# Patient Record
Sex: Female | Born: 1965 | Race: White | Hispanic: No | Smoking: Never smoker
Health system: Southern US, Community
[De-identification: ages and names within clinical notes are randomized; demographics above are authoritative.]

## PROBLEM LIST (undated history)

## (undated) DIAGNOSIS — M069 Rheumatoid arthritis, unspecified: Secondary | ICD-10-CM

## (undated) DIAGNOSIS — R011 Cardiac murmur, unspecified: Secondary | ICD-10-CM

## (undated) DIAGNOSIS — M1990 Unspecified osteoarthritis, unspecified site: Secondary | ICD-10-CM

## (undated) DIAGNOSIS — E785 Hyperlipidemia, unspecified: Secondary | ICD-10-CM

## (undated) HISTORY — PX: TOTAL ANKLE REPLACEMENT: SUR1218

## (undated) HISTORY — PX: JOINT REPLACEMENT: SHX530

## (undated) HISTORY — PX: TOTAL SHOULDER ARTHROPLASTY: SHX126

## (undated) HISTORY — PX: TOTAL KNEE ARTHROPLASTY: SHX125

## (undated) HISTORY — PX: ESOPHAGOGASTRODUODENOSCOPY: SHX1529

## (undated) HISTORY — PX: OTHER SURGICAL HISTORY: SHX169

## (undated) HISTORY — PX: TOTAL ELBOW REPLACEMENT: SUR1214

## (undated) HISTORY — PX: REPLACEMENT TOTAL KNEE: SUR1224

## (undated) HISTORY — PX: TOTAL WRIST ARTHROPLASTY: SHX813

## (undated) HISTORY — PX: REVISION TOTAL HIP ARTHROPLASTY: SHX766

## (undated) HISTORY — PX: BREAST SURGERY: SHX581

## (undated) HISTORY — PX: COLONOSCOPY: SHX174

## (undated) HISTORY — PX: TOTAL ELBOW ARTHROPLASTY: SHX812

## (undated) HISTORY — PX: WISDOM TOOTH EXTRACTION: SHX21

## (undated) HISTORY — PX: UPPER GASTROINTESTINAL ENDOSCOPY: SHX188

## (undated) HISTORY — PX: TOTAL ANKLE ARTHROPLASTY: SHX811

## (undated) HISTORY — PX: MANDIBLE RECONSTRUCTION: SHX431

## (undated) HISTORY — PX: TOTAL SHOULDER REPLACEMENT: SUR1217

## (undated) HISTORY — PX: TOTAL HIP ARTHROPLASTY: SHX124

## (undated) HISTORY — DX: Hyperlipidemia, unspecified: E78.5

## (undated) HISTORY — PX: WRIST FUSION: SHX839

---

## 2019-06-21 ENCOUNTER — Encounter (HOSPITAL_COMMUNITY): Payer: Self-pay | Admitting: *Deleted

## 2019-06-21 ENCOUNTER — Ambulatory Visit (HOSPITAL_COMMUNITY)
Admission: EM | Admit: 2019-06-21 | Discharge: 2019-06-21 | Disposition: A | Discharge status: Home / Self Care | Attending: Emergency Medicine | Admitting: Emergency Medicine

## 2019-06-21 ENCOUNTER — Other Ambulatory Visit: Payer: Self-pay

## 2019-06-21 DIAGNOSIS — J029 Acute pharyngitis, unspecified: Secondary | ICD-10-CM | POA: Insufficient documentation

## 2019-06-21 DIAGNOSIS — Z20822 Contact with and (suspected) exposure to covid-19: Secondary | ICD-10-CM

## 2019-06-21 HISTORY — DX: Rheumatoid arthritis, unspecified: M06.9

## 2019-06-21 LAB — POCT RAPID STREP A: Streptococcus, Group A Screen (Direct): NEGATIVE

## 2019-06-21 NOTE — Discharge Instructions (Signed)
Throat lozenges, gargles, chloraseptic spray, warm teas, popsicles etc to help with throat pain.   Tylenol and/or ibuprofen as needed for pain or fevers.   Due to your symptoms in presence of pandemic I do recommend Covid-19 testing. You will be called to set up an appointment time to be tested at our [facility]. Results take about 2-3 days.  Please self isolate until you receive this results.   Please return or go to the ER for any worsening of symptoms.

## 2019-06-21 NOTE — ED Triage Notes (Signed)
C/O chills, fevers up to 104, nausea, sore throat x 2 days.  Also c/o slightly pruritic generalized red rash.

## 2019-06-21 NOTE — ED Provider Notes (Signed)
[facility]    CSN: [id number] Arrival date & time: 06/21/19  1645     History   Chief Complaint Chief Complaint  Patient presents with  . Fever  . Sore Throat    HPI Diana Peters is a 53 y.o. female.   Diana Peters presents with complaints of sore throat for approximately 5 days. Fever up to 104. Chills. Has been helping her parents at there house. So thought it was maybe an arthritis flare. Rash. Sore throat. Has had strep in the past which presented similar. Today temp up to 104. Motrin 2 hours ago. Rash to bilateral upper arms, mild itching. Noted last night. Nausea, no vomiting. No cough, no congestion. No known ill contacts. No new dizziness. No urinary symptoms. Body aches and headache. Body aches can be attributed to arthritis and activity recently, however. Patient states her BP is typically in the 80's, her BP today is typical for her. Hx of RA.    ROS per HPI, negative if not otherwise mentioned.      Past Medical History:  Diagnosis Date  . Rheumatoid arthritis (HCC)   . Rheumatoid arthritis (HCC)     There are no active problems to display for this patient.   Past Surgical History:  Procedure Laterality Date  . CESAREAN SECTION     x2  . great toe fusion    . JOINT REPLACEMENT    . MANDIBLE RECONSTRUCTION    . REVISION TOTAL HIP ARTHROPLASTY     bilat  . TOTAL ANKLE ARTHROPLASTY     bilat  . TOTAL ELBOW ARTHROPLASTY     bilat  . TOTAL HIP ARTHROPLASTY     bilat  . TOTAL KNEE ARTHROPLASTY     bilat  . TOTAL SHOULDER ARTHROPLASTY     bilateral  . WRIST FUSION      OB History   No obstetric history on file.      Home Medications    Prior to Admission medications   Medication Sig Start Date End Date Taking? Authorizing Provider  azaTHIOprine (IMURAN PO) Take by mouth.   Yes [provider]  HYDROcodone-Acetaminophen (NORCO PO) Take by mouth.   Yes [provider]  Meloxicam (MOBIC PO) Take by mouth.   Yes  [provider]  MORPHINE SULFATE PO Take by mouth.   Yes [provider]  Solifenacin Succinate (VESICARE PO) Take by mouth.   Yes [provider]  Tofacitinib Citrate (XELJANZ PO) Take by mouth.   Yes [provider]    Family History Family History  Problem Relation Age of Onset  . Healthy Mother   . Healthy Father     Social History Social History   Tobacco Use  . Smoking status: Never Smoker  . Smokeless tobacco: Never Used  Substance Use Topics  . Alcohol use: Never    Frequency: Never  . Drug use: Never     Allergies   Patient has no known allergies.   Review of Systems Review of Systems   Physical Exam Triage Vital Signs ED Triage Vitals  Enc Vitals Group     BP 06/21/19 1803 (!) 85/52     Pulse Rate 06/21/19 1803 94     Resp 06/21/19 1803 18     Temp 06/21/19 1803 98.9 F (37.2 C)     Temp Source 06/21/19 1803 Temporal     SpO2 06/21/19 1803 96 %     Weight --      Height --  Head Circumference --      Peak Flow --      Pain Score 06/21/19 1805 5     Pain Loc --      Pain Edu? --      Excl. in [place]? --    No data found.  Updated Vital Signs BP (!) 85/52 (BP Location: Left Arm)   Pulse 94   Temp 98.9 F (37.2 C) (Temporal) Comment: took IBU approx 2 hrs ago  Resp 18   SpO2 96%    Physical Exam Constitutional:      General: She is not in acute distress.    Appearance: She is well-developed.  HENT:     Head: Normocephalic and atraumatic.     Mouth/Throat:     Mouth: Mucous membranes are moist. No oral lesions.     Pharynx: Posterior oropharyngeal erythema present. No pharyngeal swelling.     Tonsils: No tonsillar exudate. 1+ on the right. 1+ on the left.  Cardiovascular:     Rate and Rhythm: Normal rate and regular rhythm.     Heart sounds: Normal heart sounds.  Pulmonary:     Effort: Pulmonary effort is normal.     Breath sounds: Normal breath sounds.  Lymphadenopathy:     Cervical: Cervical  adenopathy present.  Skin:    General: Skin is warm and dry.     Findings: Rash present. Rash is urticarial.     Comments: Bilateral upper arms with red rash, non tender; no papules or vesicles   Neurological:     Mental Status: She is alert and oriented to person, place, and time.      UC Treatments / Results  Labs (all labs ordered are listed, but only abnormal results are displayed) Labs Reviewed  CULTURE, GROUP A STREP [facility])  POCT RAPID STREP A    EKG   Radiology No results found.  Procedures Procedures (including critical care time)  Medications Ordered in UC Medications - No data to display  Initial Impression / Assessment and Plan / UC Course  I have reviewed the triage vital signs and the nursing notes.  Pertinent labs & imaging results that were available during my care of the patient were reviewed by me and considered in my medical decision making (see chart for details).     Non toxic in appearance. Afebrile here. Throat without significant physical findings. Noted BP in 80's, patient insists this is normal for her and denies symptoms of this. She states she is primarily concerned about strep throat, negative today, culture pending. covid testing also ordered due to medications for RA she is taking. Supportive cares recommended. Return precautions provided. Patient verbalized understanding and agreeable to plan.   Final Clinical Impressions(s) / UC Diagnoses   Final diagnoses:  Pharyngitis, unspecified etiology     Discharge Instructions     Throat lozenges, gargles, chloraseptic spray, warm teas, popsicles etc to help with throat pain.   Tylenol and/or ibuprofen as needed for pain or fevers.   Due to your symptoms in presence of pandemic I do recommend Covid-19 testing. You will be called to set up an appointment time to be tested at our [facility]. Results take about 2-3 days.  Please self isolate until you receive this results.   Please  return or go to the ER for any worsening of symptoms.     ED Prescriptions    None     Controlled Substance Prescriptions [place] Controlled Substance Registry consulted? Not Applicable  Georgetta HaberBurky, Zaide Mcclenahan B, NP 06/21/19 [phone]

## 2019-06-22 ENCOUNTER — Telehealth: Payer: Self-pay | Admitting: *Deleted

## 2019-06-22 NOTE — Telephone Encounter (Signed)
-----   Message from Zigmund Gottron, NP sent at 06/21/2019  6:34 PM EDT ----- Regarding: covid testing needed

## 2019-06-22 NOTE — Telephone Encounter (Signed)
Pt scheduled for covid testing 06/23/19 @ [facility] @ 2:30. Instructions given and order placed

## 2019-06-22 NOTE — Telephone Encounter (Signed)
LM for pt to return call to schedule covid testing [phone] M-F 7a-7p. Order previously placed

## 2019-06-23 ENCOUNTER — Other Ambulatory Visit: Payer: Self-pay

## 2019-06-23 DIAGNOSIS — Z20822 Contact with and (suspected) exposure to covid-19: Secondary | ICD-10-CM

## 2019-06-23 LAB — CULTURE, GROUP A STREP (THRC)

## 2019-06-28 LAB — NOVEL CORONAVIRUS, NAA: SARS-CoV-2, NAA: NOT DETECTED

## 2020-04-04 ENCOUNTER — Other Ambulatory Visit: Payer: Self-pay | Admitting: Internal Medicine

## 2020-04-04 DIAGNOSIS — M8580 Other specified disorders of bone density and structure, unspecified site: Secondary | ICD-10-CM

## 2020-04-04 DIAGNOSIS — Z1231 Encounter for screening mammogram for malignant neoplasm of breast: Secondary | ICD-10-CM

## 2020-07-01 ENCOUNTER — Other Ambulatory Visit: Payer: Self-pay

## 2020-07-01 ENCOUNTER — Ambulatory Visit
Admission: RE | Admit: 2020-07-01 | Discharge: 2020-07-01 | Disposition: A | Discharge status: Home / Self Care | Source: Ambulatory Visit | Attending: Internal Medicine | Admitting: Internal Medicine

## 2020-07-01 DIAGNOSIS — Z1231 Encounter for screening mammogram for malignant neoplasm of breast: Secondary | ICD-10-CM

## 2020-07-18 ENCOUNTER — Other Ambulatory Visit: Payer: Self-pay | Admitting: Neurosurgery

## 2020-07-18 DIAGNOSIS — M4712 Other spondylosis with myelopathy, cervical region: Secondary | ICD-10-CM

## 2020-08-07 ENCOUNTER — Ambulatory Visit
Admission: RE | Admit: 2020-08-07 | Discharge: 2020-08-07 | Disposition: A | Discharge status: Home / Self Care | Source: Ambulatory Visit | Attending: Neurosurgery | Admitting: Neurosurgery

## 2020-08-07 DIAGNOSIS — M4712 Other spondylosis with myelopathy, cervical region: Secondary | ICD-10-CM

## 2020-09-13 ENCOUNTER — Other Ambulatory Visit: Payer: Self-pay | Admitting: Neurosurgery

## 2020-09-13 DIAGNOSIS — M4712 Other spondylosis with myelopathy, cervical region: Secondary | ICD-10-CM

## 2020-09-26 ENCOUNTER — Encounter (INDEPENDENT_AMBULATORY_CARE_PROVIDER_SITE_OTHER): Payer: Self-pay

## 2020-09-26 ENCOUNTER — Ambulatory Visit
Admission: RE | Admit: 2020-09-26 | Discharge: 2020-09-26 | Disposition: A | Discharge status: Home / Self Care | Source: Ambulatory Visit | Attending: Neurosurgery | Admitting: Neurosurgery

## 2020-09-26 ENCOUNTER — Other Ambulatory Visit: Payer: Self-pay

## 2020-09-26 DIAGNOSIS — M4712 Other spondylosis with myelopathy, cervical region: Secondary | ICD-10-CM

## 2020-10-03 ENCOUNTER — Other Ambulatory Visit: Payer: Self-pay

## 2020-10-03 ENCOUNTER — Ambulatory Visit
Admission: RE | Admit: 2020-10-03 | Discharge: 2020-10-03 | Disposition: A | Discharge status: Home / Self Care | Source: Ambulatory Visit | Attending: Internal Medicine | Admitting: Internal Medicine

## 2020-10-03 DIAGNOSIS — M8580 Other specified disorders of bone density and structure, unspecified site: Secondary | ICD-10-CM

## 2020-10-06 ENCOUNTER — Other Ambulatory Visit: Payer: Self-pay | Admitting: Neurosurgery

## 2020-10-19 ENCOUNTER — Encounter (HOSPITAL_COMMUNITY)
Admission: RE | Admit: 2020-10-19 | Discharge: 2020-10-19 | Disposition: A | Discharge status: Home / Self Care | Source: Ambulatory Visit | Attending: Neurosurgery | Admitting: Neurosurgery

## 2020-10-19 ENCOUNTER — Encounter (HOSPITAL_COMMUNITY): Payer: Self-pay | Admitting: *Deleted

## 2020-10-19 ENCOUNTER — Other Ambulatory Visit: Payer: Self-pay

## 2020-10-19 DIAGNOSIS — Z01818 Encounter for other preprocedural examination: Secondary | ICD-10-CM | POA: Diagnosis present

## 2020-10-19 LAB — BASIC METABOLIC PANEL
Anion gap: 7 (ref 5–15)
BUN: 21 mg/dL — ABNORMAL HIGH (ref 6–20)
CO2: 28 mmol/L (ref 22–32)
Calcium: 9.5 mg/dL (ref 8.9–10.3)
Chloride: 104 mmol/L (ref 98–111)
Creatinine, Ser: 0.99 mg/dL (ref 0.44–1.00)
GFR, Estimated: >60 mL/min (ref >60)
Glucose, Bld: 112 mg/dL — ABNORMAL HIGH (ref 70–99)
Potassium: 4.3 mmol/L (ref 3.5–5.1)
Sodium: 139 mmol/L (ref 135–145)

## 2020-10-19 LAB — CBC
HCT: 39.1 % (ref 36.0–46.0)
Hemoglobin: 12.3 g/dL (ref 12.0–15.0)
MCH: 30.1 pg (ref 26.0–34.0)
MCHC: 31.5 g/dL (ref 30.0–36.0)
MCV: 95.6 fL (ref 80.0–100.0)
Platelets: 389 10*3/uL (ref 150–400)
RBC: 4.09 MIL/uL (ref 3.87–5.11)
RDW: 14.5 % (ref 11.5–15.5)
WBC: 15.1 10*3/uL — ABNORMAL HIGH (ref 4.0–10.5)
nRBC: 0 % (ref 0.0–0.2)

## 2020-10-19 LAB — SURGICAL PCR SCREEN
MRSA, PCR: NEGATIVE
Staphylococcus aureus: POSITIVE — AB

## 2020-10-19 LAB — TYPE AND SCREEN
ABO/RH(D): A POS
Antibody Screen: NEGATIVE

## 2020-10-19 NOTE — Progress Notes (Signed)
[id number] [place], [place] - [address] AT [facility] [address] [facility] [address] [place] [address] Phone: [phone] Fax: [phone]      Your procedure is scheduled on November 17  Report to [facility] Main Entrance "A" at 0630 A.M., and check in at the Admitting office.  Call this number if you have problems the morning of surgery:  [phone]  Call [phone] if you have any questions prior to your surgery date Monday-Friday 8am-4pm    Remember:  Do not eat or drink after midnight the night before your surgery     Take these medicines the morning of surgery with A SIP OF WATER  HYDROcodone-acetaminophen (NORCO) if needed solifenacin (VESICARE)   As of today, STOP taking any Aspirin (unless otherwise instructed by your surgeon) Aleve, Naproxen, Ibuprofen, Motrin, Advil, Goody's, BC's, all herbal medications, fish oil, and all vitamins. meloxicam (MOBIC)                      Do not wear jewelry, make up, or nail polish            Do not wear lotions, powders, perfumes, or deodorant.            Do not shave 48 hours prior to surgery.              Do not bring valuables to the hospital.            [facility] is not responsible for any belongings or valuables.  Do NOT Smoke (Tobacco/Vaping) or drink Alcohol 24 hours prior to your procedure If you use a CPAP at night, you may bring all equipment for your overnight stay.   Contacts, glasses, dentures or bridgework may not be worn into surgery.      For patients admitted to the hospital, discharge time will be determined by your treatment team.   Patients discharged the day of surgery will not be allowed to drive home, and someone needs to stay with them for 24 hours.    Special instructions:   [place]- Preparing For Surgery  Before surgery, you can play an important role. Because skin is not sterile, your skin needs to be as free of germs as possible. You can reduce the number  of germs on your skin by washing with CHG (chlorahexidine gluconate) Soap before surgery.  CHG is an antiseptic cleaner which kills germs and bonds with the skin to continue killing germs even after washing.    Oral Hygiene is also important to reduce your risk of infection.  Remember - BRUSH YOUR TEETH THE MORNING OF SURGERY WITH YOUR REGULAR TOOTHPASTE  Please do not use if you have an allergy to CHG or antibacterial soaps. If your skin becomes reddened/irritated stop using the CHG.  Do not shave (including legs and underarms) for at least 48 hours prior to first CHG shower. It is OK to shave your face.  Please follow these instructions carefully.   1. Shower the NIGHT BEFORE SURGERY and the MORNING OF SURGERY with CHG Soap.   2. If you chose to wash your hair, wash your hair first as usual with your normal shampoo.  3. After you shampoo, rinse your hair and body thoroughly to remove the shampoo.  4. Use CHG as you would any other liquid soap. You can apply CHG directly to the skin and wash gently with a scrungie or a clean washcloth.  5. Apply the CHG Soap to your body ONLY FROM THE NECK DOWN.  Do not use on open wounds or open sores. Avoid contact with your eyes, ears, mouth and genitals (private parts). Wash Face and genitals (private parts)  with your normal soap.   6. Wash thoroughly, paying special attention to the area where your surgery will be performed.  7. Thoroughly rinse your body with warm water from the neck down.  8. DO NOT shower/wash with your normal soap after using and rinsing off the CHG Soap.  9. Pat yourself dry with a CLEAN TOWEL.  10. Wear CLEAN PAJAMAS to bed the night before surgery  11. Place CLEAN SHEETS on your bed the night of your first shower and DO NOT SLEEP WITH PETS.   Day of Surgery: Wear Clean/Comfortable clothing the morning of surgery Do not apply any deodorants/lotions.   Remember to brush your teeth WITH YOUR REGULAR TOOTHPASTE.    Please read over the following fact sheets that you were given.

## 2020-10-19 NOTE — Progress Notes (Signed)
PCP - Dorinda Hill - [place] medical Cardiologist - denies  Chest x-ray - denies EKG - denies Stress Test - denies ECHO - > 5 years Cardiac Cath - denies   COVID TEST- 10/24/20   Anesthesia review: yes records requested  Patient denies shortness of breath, fever, cough and chest pain at PAT appointment   All instructions explained to the patient, with a verbal understanding of the material. Patient agrees to go over the instructions while at home for a better understanding. Patient also instructed to self quarantine after being tested for COVID-19. The opportunity to ask questions was provided.

## 2020-10-19 NOTE — Progress Notes (Signed)
PCP - Dorinda Hill Cardiologist -   Chest x-ray -  EKG -  Stress Test -  ECHO -  Cardiac Cath -   Sleep Study -  CPAP -    Blood Thinner Instructions: Aspirin Instructions:  ERAS Protcol - PRE-SURGERY Ensure or G2-   COVID TEST- 10/24/20   Anesthesia review:   Patient denies shortness of breath, fever, cough and chest pain at PAT appointment   All instructions explained to the patient, with a verbal understanding of the material. Patient agrees to go over the instructions while at home for a better understanding. Patient also instructed to self quarantine after being tested for COVID-19. The opportunity to ask questions was provided.

## 2020-10-20 ENCOUNTER — Encounter (HOSPITAL_COMMUNITY): Payer: Self-pay

## 2020-10-20 NOTE — Anesthesia Preprocedure Evaluation (Addendum)
Anesthesia Evaluation  Patient identified by MRN, date of birth, ID band Patient awake    Reviewed: Allergy & Precautions, NPO status , Patient's Chart, lab work & pertinent test results  Airway Mallampati: III  TM Distance: <3 FB   Mouth opening: Limited Mouth Opening  Dental  (+) Teeth Intact, Dental Advisory Given   Pulmonary neg pulmonary ROS,    breath sounds clear to auscultation       Cardiovascular negative cardio ROS   Rhythm:Regular Rate:Normal     Neuro/Psych negative neurological ROS  negative psych ROS   GI/Hepatic negative GI ROS, Neg liver ROS,   Endo/Other  negative endocrine ROS  Renal/GU negative Renal ROS     Musculoskeletal  (+) Arthritis , Rheumatoid disorders,    Abdominal Normal abdominal exam  (+)   Peds  Hematology negative hematology ROS (+)   Anesthesia Other Findings - Bilateral UE numbness/tingling  Reproductive/Obstetrics                            Anesthesia Physical Anesthesia Plan  ASA: II  Anesthesia Plan: General   Post-op Pain Management:    Induction: Intravenous  PONV Risk Score and Plan: 4 or greater and Ondansetron, Dexamethasone, Midazolam and Scopolamine patch - Pre-op  Airway Management Planned: Oral ETT and Video Laryngoscope Planned  Additional Equipment: None  Intra-op Plan:   Post-operative Plan: Extubation in OR  Informed Consent: I have reviewed the patients History and Physical, chart, labs and discussed the procedure including the risks, benefits and alternatives for the proposed anesthesia with the patient or authorized representative who has indicated his/her understanding and acceptance.     Dental advisory given  Plan Discussed with: CRNA  Anesthesia Plan Comments: (PAT note written by Shonna Chock, PA-C. )      Anesthesia Quick Evaluation

## 2020-10-20 NOTE — Progress Notes (Signed)
Anesthesia Chart Review:  Case: [id number] Date/Time: 10/26/20 0815   Procedure: ANTERIOR CERVICAL DECOMPRESSION/DISCECTOMY FUSION C34, C45, C56, C67 (N/A ) - 3C   Anesthesia type: General   Pre-op diagnosis: CERVICAL SPONDYLOSIS WITH MYELOPATHY   [facility] RO[facility] / [facility]   Surgeons: Bedelia Person, MD      DISCUSSION: Patient is a 54 year old female scheduled for the above procedure.   History includes never smoker, RA (sero-negative RA, 1993), multiple orthopedic surgeries primarily related to her RA (mandibular reconstruction, right total wrist arthroplasty, bilateral shoulder replacements, bilateral THA with revision, bilateral elbow replacement, bilateral TKR, bilateral ankle replacement, left finger joint replacement, right great toe fusion), breast augmentation. She reported a normal echo at [facility] in [place] > 5 years ago for evaluation of a murmur.    Medical clearance by Melida Quitter, MD, "Low Risk". No murmur documented on 06/30/20 exam.  HR documented as 112 bpm at PAT, not rechecked by staff. I did call and speak with patient given her mild tachycardia and leukocytosis (WBC 15K). She reports pulse was checked after walking, but typically HR is ~ 80-90's. She is also in more pain overall due to her neck issues and also because her Actemra for RA is currently not available. She does not have chest pain, SOB, palpitations, syncope, presyncope. She has not been as active for six months due to her spinal issues, but prior to that was not limited in her activity, although admits she did not regularly exercise but could climb stairs without CV symptoms. She denied recent steroids. She denied feeling ill or having symptoms of infection such as cough, fever, rash, dysuria, diarrhea. She will wear her FitBit and monitor her HR and told to contact her PCP if HR consistently elevated (> 100). I advised her that as long as she continues to feel well without obvious signs of  infection, then would defer additional orders, if any, regarding her mild leukocytosis to her surgeon. I have notified Nikki at Dr. Maisie Fus' office of her HR and WBC. She will have him review.   Preoperative COVID-19 test is scheduled for 10/25/11. Anesthesia team to evaluate on the day of surgery.    VS: BP 103/82   Pulse (!) 112   Temp 37.2 C   Resp 18   Ht 5' (1.524 m)   Wt 48.2 kg   SpO2 96%   BMI 20.76 kg/m  HR 06/30/20 at PCP was 94 bpm.   PROVIDERS: Melida Quitter, MD is PCP [facility]) - Seen by Charm Rings, PA-C with [facility] 02/25/20.    LABS: Preoperative labs noted. See DISCUSSION. 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 Multilevel cervical spondylosis as described above is better demonstrated on the prior MRI.  EKG: N/A   CV: She reported a normal echo > 5 years ago at [facility] in [place] (when her now-ex husband was stationed there).   Past Medical History:  Diagnosis Date  . Arthritis    RA  . Heart murmur    Echo completed 5 years ago  - [facility] - report stated everything was normal    Past Surgical History:  Procedure Laterality Date  . BREAST SURGERY Bilateral    augmentation  . CESAREAN SECTION     x2  . COLONOSCOPY    . ESOPHAGOGASTRODUODENOSCOPY     . finger joint replacementt Left    joint replacements of pointer and second finger  . great toe surgery Right    fusion right great toe  . MANDIBLE RECONSTRUCTION    . REPLACEMENT TOTAL KNEE Bilateral   . REVISION TOTAL HIP ARTHROPLASTY Bilateral   . TOTAL ANKLE REPLACEMENT Bilateral   . TOTAL ELBOW REPLACEMENT Bilateral   . TOTAL HIP ARTHROPLASTY Bilateral   . TOTAL SHOULDER REPLACEMENT Bilateral   . TOTAL WRIST ARTHROPLASTY Right   . WISDOM TOOTH EXTRACTION      MEDICATIONS: . Calcium-Vitamin D-Vitamin K (VIACTIV PO)  . ferrous sulfate 325 (65 FE) MG tablet  . HYDROcodone-acetaminophen (NORCO) 10-325 MG tablet  . meloxicam (MOBIC) 15 MG tablet  . methylcellulose oral powder  . Multiple Vitamin (MULTIVITAMIN WITH MINERALS) TABS tablet  . solifenacin (VESICARE) 10 MG tablet   No current facility-administered medications for this encounter.   Reported that she is normally on Actemra for RA, but it is currently unavailable.   Shonna Chock, PA-C Surgical Short Stay/Anesthesiology [facility] Phone [phone] [facility] Phone [phone] 10/20/2020 12:56 PM

## 2020-10-24 ENCOUNTER — Other Ambulatory Visit (HOSPITAL_COMMUNITY)
Admission: RE | Admit: 2020-10-24 | Discharge: 2020-10-24 | Disposition: A | Discharge status: Home / Self Care | Source: Ambulatory Visit | Attending: Neurosurgery | Admitting: Neurosurgery

## 2020-10-24 DIAGNOSIS — Z01812 Encounter for preprocedural laboratory examination: Secondary | ICD-10-CM | POA: Diagnosis not present

## 2020-10-24 DIAGNOSIS — Z20822 Contact with and (suspected) exposure to covid-19: Secondary | ICD-10-CM | POA: Diagnosis not present

## 2020-10-24 LAB — SARS CORONAVIRUS 2 (TAT 6-24 HRS): SARS Coronavirus 2: NEGATIVE

## 2020-10-26 ENCOUNTER — Other Ambulatory Visit: Payer: Self-pay

## 2020-10-26 ENCOUNTER — Encounter (HOSPITAL_COMMUNITY): Payer: Self-pay

## 2020-10-26 ENCOUNTER — Inpatient Hospital Stay (HOSPITAL_COMMUNITY): Admitting: Certified Registered"

## 2020-10-26 ENCOUNTER — Observation Stay (HOSPITAL_COMMUNITY)
Admission: RE | Admit: 2020-10-26 | Discharge: 2020-10-27 | Disposition: A | Discharge status: Home / Self Care | Attending: Neurosurgery | Admitting: Neurosurgery

## 2020-10-26 ENCOUNTER — Inpatient Hospital Stay (HOSPITAL_COMMUNITY)

## 2020-10-26 ENCOUNTER — Inpatient Hospital Stay (HOSPITAL_COMMUNITY): Admitting: Vascular Surgery

## 2020-10-26 ENCOUNTER — Encounter (HOSPITAL_COMMUNITY)
Admission: RE | Disposition: A | Discharge status: Home / Self Care | Payer: Self-pay | Source: Home / Self Care | Attending: Neurosurgery

## 2020-10-26 DIAGNOSIS — Z96653 Presence of artificial knee joint, bilateral: Secondary | ICD-10-CM | POA: Insufficient documentation

## 2020-10-26 DIAGNOSIS — Z96643 Presence of artificial hip joint, bilateral: Secondary | ICD-10-CM | POA: Diagnosis not present

## 2020-10-26 DIAGNOSIS — Z96611 Presence of right artificial shoulder joint: Secondary | ICD-10-CM | POA: Diagnosis not present

## 2020-10-26 DIAGNOSIS — Z96612 Presence of left artificial shoulder joint: Secondary | ICD-10-CM | POA: Diagnosis not present

## 2020-10-26 DIAGNOSIS — M4712 Other spondylosis with myelopathy, cervical region: Secondary | ICD-10-CM | POA: Diagnosis not present

## 2020-10-26 DIAGNOSIS — Z419 Encounter for procedure for purposes other than remedying health state, unspecified: Secondary | ICD-10-CM

## 2020-10-26 DIAGNOSIS — G992 Myelopathy in diseases classified elsewhere: Secondary | ICD-10-CM

## 2020-10-26 DIAGNOSIS — R531 Weakness: Secondary | ICD-10-CM | POA: Diagnosis present

## 2020-10-26 HISTORY — DX: Unspecified osteoarthritis, unspecified site: M19.90

## 2020-10-26 HISTORY — DX: Cardiac murmur, unspecified: R01.1

## 2020-10-26 HISTORY — PX: ANTERIOR CERVICAL DECOMPRESSION/DISCECTOMY FUSION 4 LEVELS: SHX5556

## 2020-10-26 LAB — ABO/RH: ABO/RH(D): A POS

## 2020-10-26 SURGERY — ANTERIOR CERVICAL DECOMPRESSION/DISCECTOMY FUSION 4 LEVELS
Anesthesia: General

## 2020-10-26 MED ORDER — METHYLCELLULOSE (LAXATIVE) PO POWD: 1.0000 | Freq: Every day | ORAL | Status: DC

## 2020-10-26 MED ORDER — MIDAZOLAM HCL 5 MG/5ML IJ SOLN
INTRAMUSCULAR | Status: DC | PRN
  Administered 2020-10-26: 2 mg via INTRAVENOUS

## 2020-10-26 MED ORDER — ORAL CARE MOUTH RINSE: 15.0000 mL | Freq: Once | OROMUCOSAL | Status: AC

## 2020-10-26 MED ORDER — METHOCARBAMOL 500 MG PO TABS
500.0000 mg | ORAL_TABLET | Freq: Four times a day (QID) | ORAL | Status: DC | PRN
  Administered 2020-10-26 – 2020-10-27 (×3): 500 mg via ORAL
  Filled 2020-10-26 (×3): qty 1

## 2020-10-26 MED ORDER — DOCUSATE SODIUM 100 MG PO CAPS
100.0000 mg | ORAL_CAPSULE | Freq: Two times a day (BID) | ORAL | Status: DC
  Filled 2020-10-26: qty 1

## 2020-10-26 MED ORDER — DEXAMETHASONE SODIUM PHOSPHATE 10 MG/ML IJ SOLN
INTRAMUSCULAR | Status: DC | PRN
  Administered 2020-10-26: 10 mg via INTRAVENOUS

## 2020-10-26 MED ORDER — ACETAMINOPHEN 325 MG PO TABS: 325.0000 mg | ORAL_TABLET | Freq: Once | ORAL | Status: DC | PRN

## 2020-10-26 MED ORDER — FENTANYL CITRATE (PF) 100 MCG/2ML IJ SOLN
INTRAMUSCULAR | Status: DC | PRN
  Administered 2020-10-26 (×5): 50 ug via INTRAVENOUS

## 2020-10-26 MED ORDER — ADULT MULTIVITAMIN W/MINERALS CH
1.0000 | ORAL_TABLET | Freq: Every day | ORAL | Status: DC
  Filled 2020-10-26: qty 1

## 2020-10-26 MED ORDER — CEFAZOLIN SODIUM-DEXTROSE 2-4 GM/100ML-% IV SOLN
2.0000 g | Freq: Three times a day (TID) | INTRAVENOUS | Status: AC
  Administered 2020-10-26 – 2020-10-27 (×3): 2 g via INTRAVENOUS
  Filled 2020-10-26 (×3): qty 100

## 2020-10-26 MED ORDER — SODIUM CHLORIDE 0.9% FLUSH
3.0000 mL | Freq: Two times a day (BID) | INTRAVENOUS | Status: DC
  Administered 2020-10-26: 3 mL via INTRAVENOUS

## 2020-10-26 MED ORDER — ONDANSETRON HCL 4 MG PO TABS: 4.0000 mg | ORAL_TABLET | Freq: Four times a day (QID) | ORAL | Status: DC | PRN

## 2020-10-26 MED ORDER — PHENYLEPHRINE HCL-NACL 10-0.9 MG/250ML-% IV SOLN
INTRAVENOUS | Status: DC | PRN
  Administered 2020-10-26: 20 ug/min via INTRAVENOUS

## 2020-10-26 MED ORDER — LACTATED RINGERS IV SOLN: INTRAVENOUS | Status: DC

## 2020-10-26 MED ORDER — PROPOFOL 10 MG/ML IV BOLUS
INTRAVENOUS | Status: DC | PRN
  Administered 2020-10-26: 150 mg via INTRAVENOUS

## 2020-10-26 MED ORDER — LIDOCAINE 2% (20 MG/ML) 5 ML SYRINGE
INTRAMUSCULAR | Status: DC | PRN
  Administered 2020-10-26: 40 mg via INTRAVENOUS

## 2020-10-26 MED ORDER — ACETAMINOPHEN 10 MG/ML IV SOLN
INTRAVENOUS | Status: AC
  Filled 2020-10-26: qty 100

## 2020-10-26 MED ORDER — AMISULPRIDE (ANTIEMETIC) 5 MG/2ML IV SOLN: 10.0000 mg | Freq: Once | INTRAVENOUS | Status: DC | PRN

## 2020-10-26 MED ORDER — DARIFENACIN HYDROBROMIDE ER 7.5 MG PO TB24
7.5000 mg | ORAL_TABLET | Freq: Every day | ORAL | Status: DC
  Filled 2020-10-26: qty 1

## 2020-10-26 MED ORDER — LIDOCAINE-EPINEPHRINE 1 %-1:100000 IJ SOLN
INTRAMUSCULAR | Status: DC | PRN
  Administered 2020-10-26: 4 mL

## 2020-10-26 MED ORDER — ACETAMINOPHEN 325 MG PO TABS: 650.0000 mg | ORAL_TABLET | ORAL | Status: DC | PRN

## 2020-10-26 MED ORDER — POTASSIUM CHLORIDE IN NACL 20-0.9 MEQ/L-% IV SOLN: INTRAVENOUS | Status: DC

## 2020-10-26 MED ORDER — LACTATED RINGERS IV SOLN: INTRAVENOUS | Status: DC | PRN

## 2020-10-26 MED ORDER — MENTHOL 3 MG MT LOZG: 1.0000 | LOZENGE | OROMUCOSAL | Status: DC | PRN

## 2020-10-26 MED ORDER — FLEET ENEMA 7-19 GM/118ML RE ENEM: 1.0000 | ENEMA | Freq: Once | RECTAL | Status: DC | PRN

## 2020-10-26 MED ORDER — SODIUM CHLORIDE 0.9% FLUSH: 3.0000 mL | INTRAVENOUS | Status: DC | PRN

## 2020-10-26 MED ORDER — SCOPOLAMINE 1 MG/3DAYS TD PT72
MEDICATED_PATCH | TRANSDERMAL | Status: DC | PRN
  Administered 2020-10-26: 1 via TRANSDERMAL

## 2020-10-26 MED ORDER — SUGAMMADEX SODIUM 200 MG/2ML IV SOLN
INTRAVENOUS | Status: DC | PRN
  Administered 2020-10-26: 200 mg via INTRAVENOUS

## 2020-10-26 MED ORDER — THROMBIN 5000 UNITS EX SOLR
CUTANEOUS | Status: DC | PRN
  Administered 2020-10-26 (×2): 5000 [IU] via TOPICAL

## 2020-10-26 MED ORDER — HYDROMORPHONE HCL 1 MG/ML IJ SOLN
INTRAMUSCULAR | Status: AC
  Filled 2020-10-26: qty 1

## 2020-10-26 MED ORDER — LIDOCAINE 2% (20 MG/ML) 5 ML SYRINGE
INTRAMUSCULAR | Status: AC
  Filled 2020-10-26: qty 5

## 2020-10-26 MED ORDER — THROMBIN 5000 UNITS EX SOLR
OROMUCOSAL | Status: DC | PRN
  Administered 2020-10-26: 5 mL via TOPICAL

## 2020-10-26 MED ORDER — ONDANSETRON HCL 4 MG/2ML IJ SOLN
INTRAMUSCULAR | Status: DC | PRN
  Administered 2020-10-26: 4 mg via INTRAVENOUS

## 2020-10-26 MED ORDER — ACETAMINOPHEN 160 MG/5ML PO SOLN: 325.0000 mg | Freq: Once | ORAL | Status: DC | PRN

## 2020-10-26 MED ORDER — CHLORHEXIDINE GLUCONATE CLOTH 2 % EX PADS: 6.0000 | MEDICATED_PAD | Freq: Once | CUTANEOUS | Status: DC

## 2020-10-26 MED ORDER — ONDANSETRON HCL 4 MG/2ML IJ SOLN: 4.0000 mg | Freq: Four times a day (QID) | INTRAMUSCULAR | Status: DC | PRN

## 2020-10-26 MED ORDER — SODIUM CHLORIDE 0.9 % IV SOLN: 250.0000 mL | INTRAVENOUS | Status: DC

## 2020-10-26 MED ORDER — ACETAMINOPHEN 10 MG/ML IV SOLN: 1000.0000 mg | Freq: Once | INTRAVENOUS | Status: DC | PRN

## 2020-10-26 MED ORDER — MORPHINE SULFATE (PF) 2 MG/ML IV SOLN
2.0000 mg | INTRAVENOUS | Status: DC | PRN
  Administered 2020-10-26 (×2): 2 mg via INTRAVENOUS
  Filled 2020-10-26 (×2): qty 1

## 2020-10-26 MED ORDER — ACETAMINOPHEN 10 MG/ML IV SOLN
INTRAVENOUS | Status: DC | PRN
  Administered 2020-10-26: 1000 mg via INTRAVENOUS

## 2020-10-26 MED ORDER — CEFAZOLIN SODIUM-DEXTROSE 2-3 GM-%(50ML) IV SOLR
INTRAVENOUS | Status: DC | PRN
  Administered 2020-10-26: 2 g via INTRAVENOUS

## 2020-10-26 MED ORDER — SUCCINYLCHOLINE CHLORIDE 200 MG/10ML IV SOSY
PREFILLED_SYRINGE | INTRAVENOUS | Status: AC
  Filled 2020-10-26: qty 10

## 2020-10-26 MED ORDER — HYDROCODONE-ACETAMINOPHEN 10-325 MG PO TABS
1.0000 | ORAL_TABLET | ORAL | Status: DC | PRN
  Administered 2020-10-26 – 2020-10-27 (×4): 1 via ORAL
  Filled 2020-10-26 (×4): qty 1

## 2020-10-26 MED ORDER — ENOXAPARIN SODIUM 40 MG/0.4ML ~~LOC~~ SOLN: 40.0000 mg | SUBCUTANEOUS | Status: DC

## 2020-10-26 MED ORDER — HYDROMORPHONE HCL 1 MG/ML IJ SOLN
0.2500 mg | INTRAMUSCULAR | Status: DC | PRN
  Administered 2020-10-26 (×2): 0.5 mg via INTRAVENOUS

## 2020-10-26 MED ORDER — LIDOCAINE-EPINEPHRINE 1 %-1:100000 IJ SOLN
INTRAMUSCULAR | Status: AC
  Filled 2020-10-26: qty 1

## 2020-10-26 MED ORDER — HEMOSTATIC AGENTS (NO CHARGE) OPTIME
TOPICAL | Status: DC | PRN
  Administered 2020-10-26: 1 via TOPICAL

## 2020-10-26 MED ORDER — FENTANYL CITRATE (PF) 250 MCG/5ML IJ SOLN
INTRAMUSCULAR | Status: AC
  Filled 2020-10-26: qty 5

## 2020-10-26 MED ORDER — THROMBIN 5000 UNITS EX SOLR
CUTANEOUS | Status: AC
  Filled 2020-10-26: qty 15000

## 2020-10-26 MED ORDER — ONDANSETRON HCL 4 MG/2ML IJ SOLN
INTRAMUSCULAR | Status: AC
  Filled 2020-10-26: qty 2

## 2020-10-26 MED ORDER — MEPERIDINE HCL 25 MG/ML IJ SOLN: 6.2500 mg | INTRAMUSCULAR | Status: DC | PRN

## 2020-10-26 MED ORDER — DEXAMETHASONE SODIUM PHOSPHATE 10 MG/ML IJ SOLN
INTRAMUSCULAR | Status: AC
  Filled 2020-10-26: qty 1

## 2020-10-26 MED ORDER — BUPIVACAINE HCL 0.5 % IJ SOLN
INTRAMUSCULAR | Status: DC | PRN
  Administered 2020-10-26: 4 mL

## 2020-10-26 MED ORDER — CHLORHEXIDINE GLUCONATE 0.12 % MT SOLN
15.0000 mL | Freq: Once | OROMUCOSAL | Status: AC
  Administered 2020-10-26: 15 mL via OROMUCOSAL
  Filled 2020-10-26: qty 15

## 2020-10-26 MED ORDER — PROPOFOL 10 MG/ML IV BOLUS
INTRAVENOUS | Status: AC
  Filled 2020-10-26: qty 20

## 2020-10-26 MED ORDER — ROCURONIUM BROMIDE 10 MG/ML (PF) SYRINGE
PREFILLED_SYRINGE | INTRAVENOUS | Status: DC | PRN
  Administered 2020-10-26: 50 mg via INTRAVENOUS
  Administered 2020-10-26: 30 mg via INTRAVENOUS
  Administered 2020-10-26: 20 mg via INTRAVENOUS

## 2020-10-26 MED ORDER — 0.9 % SODIUM CHLORIDE (POUR BTL) OPTIME
TOPICAL | Status: DC | PRN
  Administered 2020-10-26: 1000 mL

## 2020-10-26 MED ORDER — SENNA 8.6 MG PO TABS
1.0000 | ORAL_TABLET | Freq: Two times a day (BID) | ORAL | Status: DC
  Filled 2020-10-26 (×2): qty 1

## 2020-10-26 MED ORDER — PHENOL 1.4 % MT LIQD: 1.0000 | OROMUCOSAL | Status: DC | PRN

## 2020-10-26 MED ORDER — ACETAMINOPHEN 650 MG RE SUPP: 650.0000 mg | RECTAL | Status: DC | PRN

## 2020-10-26 MED ORDER — PHENYLEPHRINE 40 MCG/ML (10ML) SYRINGE FOR IV PUSH (FOR BLOOD PRESSURE SUPPORT)
PREFILLED_SYRINGE | INTRAVENOUS | Status: AC
  Filled 2020-10-26: qty 10

## 2020-10-26 MED ORDER — SCOPOLAMINE 1 MG/3DAYS TD PT72
MEDICATED_PATCH | TRANSDERMAL | Status: AC
  Filled 2020-10-26: qty 1

## 2020-10-26 MED ORDER — BISACODYL 10 MG RE SUPP: 10.0000 mg | Freq: Every day | RECTAL | Status: DC | PRN

## 2020-10-26 MED ORDER — CEFAZOLIN SODIUM-DEXTROSE 2-4 GM/100ML-% IV SOLN
2.0000 g | INTRAVENOUS | Status: DC
  Filled 2020-10-26: qty 100

## 2020-10-26 MED ORDER — MIDAZOLAM HCL 2 MG/2ML IJ SOLN
INTRAMUSCULAR | Status: AC
  Filled 2020-10-26: qty 2

## 2020-10-26 MED ORDER — BUPIVACAINE HCL (PF) 0.5 % IJ SOLN
INTRAMUSCULAR | Status: AC
  Filled 2020-10-26: qty 30

## 2020-10-26 MED ORDER — FERROUS SULFATE 325 (65 FE) MG PO TABS
325.0000 mg | ORAL_TABLET | Freq: Every day | ORAL | Status: DC
  Administered 2020-10-27: 325 mg via ORAL
  Filled 2020-10-26: qty 1

## 2020-10-26 MED ORDER — POLYETHYLENE GLYCOL 3350 17 G PO PACK: 17.0000 g | PACK | Freq: Every day | ORAL | Status: DC | PRN

## 2020-10-26 SURGICAL SUPPLY — 75 items
BAND RUBBER #18 3X1/16 STRL (MISCELLANEOUS) ×6 IMPLANT
BASKET BONE COLLECTION (BASKET) ×3 IMPLANT
BENZOIN TINCTURE PRP APPL 2/3 (GAUZE/BANDAGES/DRESSINGS) ×3 IMPLANT
BIT DRILL 13 (BIT) ×2 IMPLANT
BIT DRILL 13MM (BIT) ×1
BIT DRILL NEURO 2X3.1 SFT TUCH (MISCELLANEOUS) ×1 IMPLANT
BLADE CLIPPER SURG (BLADE) IMPLANT
BLADE SURG 11 STRL SS (BLADE) IMPLANT
BLADE ULTRA TIP 2M (BLADE) IMPLANT
BONE VIVIGEN FORMABLE 1.3CC (Bone Implant) ×6 IMPLANT
BUR MATCHSTICK NEURO 3.0 LAGG (BURR) ×3 IMPLANT
BUR RND DIAMOND ELITE 4.0 (BURR) IMPLANT
BUR RND DIAMOND ELITE 4.0MM (BURR)
CAGE ENDOSKELETON TC 14X12X5 (Cage) ×6 IMPLANT
CAGE IFD NL SMALL 5 6D (Cage) ×3 IMPLANT
CAGE LORDOTIC 6 SM (Cage) ×2 IMPLANT
CAGE LORDOTIC 6MM SM (Cage) ×1 IMPLANT
CANISTER SUCT 3000ML PPV (MISCELLANEOUS) ×3 IMPLANT
CLOSURE WOUND 1/2 X4 (GAUZE/BANDAGES/DRESSINGS) ×1
COVER PLATE (Plate) ×2 IMPLANT
COVER WAND RF STERILE (DRAPES) ×3 IMPLANT
DECANTER SPIKE VIAL GLASS SM (MISCELLANEOUS) ×3 IMPLANT
DRAPE C-ARM 42X72 X-RAY (DRAPES) ×6 IMPLANT
DRAPE HALF SHEET 40X57 (DRAPES) IMPLANT
DRAPE LAPAROTOMY 100X72 PEDS (DRAPES) ×3 IMPLANT
DRAPE MICROSCOPE LEICA (MISCELLANEOUS) ×3 IMPLANT
DRILL NEURO 2X3.1 SOFT TOUCH (MISCELLANEOUS) ×3
DRSG MEPILEX BORDER 4X4 (GAUZE/BANDAGES/DRESSINGS) ×3 IMPLANT
DRSG OPSITE 4X5.5 SM (GAUZE/BANDAGES/DRESSINGS) ×6 IMPLANT
DURAPREP 26ML APPLICATOR (WOUND CARE) ×3 IMPLANT
ELECT COATED BLADE 2.86 ST (ELECTRODE) ×3 IMPLANT
ELECT REM PT RETURN 9FT ADLT (ELECTROSURGICAL) ×3
ELECTRODE REM PT RTRN 9FT ADLT (ELECTROSURGICAL) ×1 IMPLANT
GAUZE 4X4 16PLY RFD (DISPOSABLE) IMPLANT
GLOVE BIO SURGEON STRL SZ8 (GLOVE) ×3 IMPLANT
GLOVE BIOGEL PI IND STRL 7.0 (GLOVE) ×2 IMPLANT
GLOVE BIOGEL PI IND STRL 7.5 (GLOVE) ×1 IMPLANT
GLOVE BIOGEL PI IND STRL 8 (GLOVE) ×1 IMPLANT
GLOVE BIOGEL PI INDICATOR 7.0 (GLOVE) ×4
GLOVE BIOGEL PI INDICATOR 7.5 (GLOVE) ×2
GLOVE BIOGEL PI INDICATOR 8 (GLOVE) ×2
GLOVE ECLIPSE 7.0 STRL STRAW (GLOVE) ×9 IMPLANT
GLOVE ECLIPSE 7.5 STRL STRAW (GLOVE) ×9 IMPLANT
GOWN STRL REUS W/ TWL LRG LVL3 (GOWN DISPOSABLE) ×2 IMPLANT
GOWN STRL REUS W/ TWL XL LVL3 (GOWN DISPOSABLE) ×2 IMPLANT
GOWN STRL REUS W/TWL 2XL LVL3 (GOWN DISPOSABLE) ×3 IMPLANT
GOWN STRL REUS W/TWL LRG LVL3 (GOWN DISPOSABLE) ×4
GOWN STRL REUS W/TWL XL LVL3 (GOWN DISPOSABLE) ×4
HEMOSTAT POWDER KIT SURGIFOAM (HEMOSTASIS) ×3 IMPLANT
KIT BASIN OR (CUSTOM PROCEDURE TRAY) ×3 IMPLANT
KIT TURNOVER KIT B (KITS) ×3 IMPLANT
NEEDLE HYPO 22GX1.5 SAFETY (NEEDLE) ×3 IMPLANT
NEEDLE SPNL 22GX3.5 QUINCKE BK (NEEDLE) ×3 IMPLANT
NS IRRIG 1000ML POUR BTL (IV SOLUTION) ×3 IMPLANT
PACK LAMINECTOMY NEURO (CUSTOM PROCEDURE TRAY) ×3 IMPLANT
PAD ARMBOARD 7.5X6 YLW CONV (MISCELLANEOUS) ×9 IMPLANT
PIN DISTRACTION 14MM (PIN) ×6 IMPLANT
PLATE LOCK ENDO TCS (Plate) ×1 IMPLANT
PLATE ZEVO 3LVL 48MM (Plate) ×3 IMPLANT
SCREW 13MM (Screw) ×24 IMPLANT
SCREW LOCKING 14MMX3.5MM (Screw) ×6 IMPLANT
SPONGE INTESTINAL PEANUT (DISPOSABLE) ×3 IMPLANT
SPONGE SURGIFOAM ABS GEL SZ50 (HEMOSTASIS) ×3 IMPLANT
STAPLER VISISTAT 35W (STAPLE) ×3 IMPLANT
STRIP CLOSURE SKIN 1/2X4 (GAUZE/BANDAGES/DRESSINGS) ×2 IMPLANT
SUT MNCRL AB 4-0 PS2 18 (SUTURE) ×3 IMPLANT
SUT SILK 2 0 TIES 10X30 (SUTURE) IMPLANT
SUT VIC AB 3-0 SH 8-18 (SUTURE) ×3 IMPLANT
TAPE CLOTH 3X10 TAN LF (GAUZE/BANDAGES/DRESSINGS) ×3 IMPLANT
TOWEL GREEN STERILE (TOWEL DISPOSABLE) ×3 IMPLANT
TOWEL GREEN STERILE FF (TOWEL DISPOSABLE) ×3 IMPLANT
TRAY FOLEY W/BAG SLVR 14FR (SET/KITS/TRAYS/PACK) ×3 IMPLANT
TRAY FOLEY W/BAG SLVR 16FR (SET/KITS/TRAYS/PACK)
TRAY FOLEY W/BAG SLVR 16FR ST (SET/KITS/TRAYS/PACK) IMPLANT
WATER STERILE IRR 1000ML POUR (IV SOLUTION) ×3 IMPLANT

## 2020-10-26 NOTE — H&P (Signed)
CC: myelopathy  HPI:     Patient is a 54 y.o. female with rheumatoid arthritis and 19 orthopedic surgeries related to this who presented to clinic with progressive weakness and numbness in the hands as well as difficulty with walking and coordination.  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  As her symptoms were progressing despite nonsurgical therapies, I discussed surgical treatments for her cervical stenosis and myelopathy.  She is here for elective ACDFs. Of note, she does have baseline swallowing problems, but currently she believes it is better than usual.   There are no problems to display for this patient.  Past Medical History:  Diagnosis Date  . Arthritis    RA  . Heart murmur    Echo completed 5 years ago  - [facility] - report stated everything was normal    Past Surgical History:  Procedure Laterality Date  . BREAST SURGERY Bilateral    augmentation  . CESAREAN SECTION     x2  . COLONOSCOPY    . ESOPHAGOGASTRODUODENOSCOPY    . finger joint replacementt Left    joint replacements of pointer and second finger  . great toe surgery Right    fusion right great toe  . MANDIBLE RECONSTRUCTION    . REPLACEMENT TOTAL KNEE Bilateral   . REVISION TOTAL HIP ARTHROPLASTY Bilateral   . TOTAL ANKLE REPLACEMENT Bilateral   . TOTAL ELBOW REPLACEMENT Bilateral   . TOTAL HIP ARTHROPLASTY Bilateral   . TOTAL SHOULDER REPLACEMENT Bilateral   . TOTAL WRIST ARTHROPLASTY Right   . WISDOM TOOTH EXTRACTION      Medications Prior to Admission  Medication Sig Dispense Refill Last Dose  . Calcium-Vitamin D-Vitamin K (VIACTIV PO) Take 1 tablet by mouth daily.   Past Week at Unknown time  . ferrous sulfate 325 (65 FE) MG tablet Take 325 mg by mouth daily with breakfast.   Past Week at Unknown time  . HYDROcodone-acetaminophen (NORCO) 10-325 MG tablet Take 1 tablet by mouth  every 6 (six) hours as needed for pain.   10/26/2020 at 0430  . meloxicam (MOBIC) 15 MG tablet Take 15 mg by mouth daily.   Past Week at Unknown time  . methylcellulose oral powder Take 1 packet by mouth daily.   10/25/2020 at Unknown time  . Multiple Vitamin (MULTIVITAMIN WITH MINERALS) TABS tablet Take 1 tablet by mouth daily.   Past Week at Unknown time  . solifenacin (VESICARE) 10 MG tablet Take 10 mg by mouth daily.   Past Week at Unknown time   No Known Allergies  Social History   Tobacco Use  . Smoking status: Never Smoker  . Smokeless tobacco: Never Used  Substance Use Topics  . Alcohol use: Yes    Comment: very rarely    Family History  Problem Relation Age of Onset  . Breast cancer Maternal Aunt   . Breast cancer Maternal Grandmother   . Breast cancer Maternal Aunt      Review of Systems Pertinent items are noted in HPI.  Objective:   Patient Vitals for the past 8 hrs:  BP Temp Temp src Pulse Resp SpO2 Height Weight  10/26/20 0726 110/72 97.8 F (36.6 C) Temporal 95 18 100 % 5' (1.524 m) 47.2 kg  10/26/20 0701 -- -- -- -- -- -- 5' (1.524 m) 48.2 kg   No intake/output data recorded. No intake/output data recorded.  General : Alert, cooperative, no distress, appears stated age. Small stature.   Head:  Normocephalic   Eyes: PERRL, conjunctiva/corneas clear, EOM's intact. Fundi could not be visualized. Neck:  Stiff nec, Chest:  Respirations unlabored Chest wall: no tenderness or deformity Heart: Regular rate and rhythm Abdomen: Soft, nontender and nondistended Extremities: warm and well-perfused Skin: normal turgor, color and texture Neurologic:  Alert, oriented x 3.  Eyes open spontaneously. PERRL, EOMI, VFC, no facial droop. V1-3 intact.  No dysarthria, tongue protrusion symmetric.  CNII-XII intact. 4+ reflexes, + Hoffman's bilateral UEs.  No pronator drift, full strength in legs.       Data Review CBC:  Lab Results  Component Value Date   WBC  15.1 (H) 10/19/2020   RBC 4.09 10/19/2020   BMP:  Lab Results  Component Value Date   GLUCOSE 112 (H) 10/19/2020   CO2 28 10/19/2020   BUN 21 (H) 10/19/2020   CREATININE 0.99 10/19/2020   CALCIUM 9.5 10/19/2020   Radiology review: MRI and CT reviewed.  Multilevel cervical stenosis with cord impingement.  Autofusion of Oc-C1-C2  Assessment:  54 yo F with small stature, RA with multiple surgeries for joint disease has cervical stenosis with progressive myelopathy  Plan:   -I had several long discussions with Stacy regarding treatment options.  Given her progressive symptoms, I did suggest cervical decompression options.  Multilevel ACDF versus posterior laminectomy and fusion were discussed as options.  Patient preferred the ACDF option as she had family members who had the surgery with good outcome and she has more anterior compression then posterior, with significant height loss which could be potentially corrected.  However, with her preoperative dysphagia, more significant dysphagia could ensue after surgery.  The patient understood this but wished to proceed.  To help mitigate this, I will plan on using low-profile cages with integrated screws.  I also mentioned that with her autofusion of the craniocervical junction, I may find an surgery that exposure of the upper cervical spine would be very difficult.  I said I would address any levels that I felt I could safely reach, and she will need to wear cervical collar after surgery.  She certainly would potentially need a posterior surgery in the future depending on how she heals.  All questions and concerns were answered.  She wished to proceed.

## 2020-10-26 NOTE — Anesthesia Procedure Notes (Addendum)
Procedure Name: Intubation Date/Time: 10/26/2020 8:34 AM Performed by: Lavell Luster, CRNA Pre-anesthesia Checklist: Patient identified, Emergency Drugs available, Suction available, Patient being monitored and Timeout performed Patient Re-evaluated:Patient Re-evaluated prior to induction Oxygen Delivery Method: Circle system utilized Preoxygenation: Pre-oxygenation with 100% oxygen Induction Type: IV induction Ventilation: Mask ventilation without difficulty Laryngoscope Size: Mac, Glidescope and 2 Grade View: Grade II Tube type: Oral Tube size: 7.0 mm Number of attempts: 2 Airway Equipment and Method: Video-laryngoscopy Placement Confirmation: ETT inserted through vocal cords under direct vision,  positive ETCO2 and breath sounds checked- equal and bilateral Tube secured with: Tape Dental Injury: Teeth and Oropharynx as per pre-operative assessment  Difficulty Due To: Difficulty was anticipated, Difficult Airway- due to reduced neck mobility, Difficult Airway- due to dentition, Difficult Airway- due to limited oral opening and Difficult Airway- due to anterior larynx Future Recommendations: Recommend- induction with short-acting agent, and alternative techniques readily available Comments: DL with MAC 3 blade on videolarygoscope with poor view and ETT would not pass.  Switched to pediatric MAC 2 blade on videolarygoscope with better view(grade II) and ETT 7.0 passed easily.  Dr Smith Robert verified placement.  Decadron administered after intubation.  Difficult airway was anticipated.  Plan leak test at end of case prior to extubation. Henderson Cloud, CRNA

## 2020-10-26 NOTE — Transfer of Care (Signed)
Immediate Anesthesia Transfer of Care Note  Patient: Diana Peters  Procedure(s) Performed: ANTERIOR CERVICAL DECOMPRESSION/DISCECTOMY FUSION CERVICAL THREE-FOUR, CERVICAL FOUR-FIVE, CERVICAL FIVE-SIX, CERVICAL SIX-SEVEN (N/A )  Patient Location: PACU  Anesthesia Type:General  Level of Consciousness: awake, alert  and oriented  Airway & Oxygen Therapy: Patient connected to face mask oxygen  Post-op Assessment: Post -op Vital signs reviewed and stable  Post vital signs: stable  Last Vitals:  Vitals Value Taken Time  BP 149/81 10/26/20 1343  Temp    Pulse 101 10/26/20 1349  Resp 16 10/26/20 1349  SpO2 97 % 10/26/20 1349  Vitals shown include unvalidated device data.  Last Pain:  Vitals:   10/26/20 0726  TempSrc: Temporal  PainSc:       Patients Stated Pain Goal: 4 (10/26/20 0701)  Complications: No complications documented.

## 2020-10-26 NOTE — Op Note (Signed)
PREOP DIAGNOSIS: cervical stenosis with myelopathy  POSTOP DIAGNOSIS: Cervical stenosis with myelopathy   PROCEDURE: 1. Arthrodesis C3-4, anterior interbody technique, including Discectomy for decompression of spinal cord and exiting nerve roots with foraminotomies  2. Arthrodesis, additional level C4-5 anterior interbody technique, including Discectomy for decompression of spinal cord and exiting nerve roots with foraminotomies  3. Arthrodesis, additional level C5-6 anterior interbody technique, including Discectomy for decompression of spinal cord and exiting nerve roots with foraminotomies  4. Arthrodesis, additional level C6-7 anterior interbody technique, including Discectomy for decompression of spinal cord and exiting nerve roots with foraminotomies  5. Placement of intervertebral biomechanical device with integrated screws at C3-4 6. Placement of intervertebral biomechanical device C4-5  Placement of intervertebral biomechanical device C5-6 7. Placement of intervertebral biomechanical device C6-7 8. Placement of anterior instrumentation consisting of interbody plate and screws C4-7 9. Use of morselized bone allograft  10. Use of intraoperative microscope  SURGEON: Dr. Hoyt Koch, MD  ASSISTANT: Marikay Alar, MD.  Please note, no qualified trainees were available to assist with the procedure.  Assistance was required for retraction of the visceral structures to safely allow for instrumentation.  ANESTHESIA: General Endotracheal  EBL: 25 cc  IMPLANTS: [employer]  5 mm Titan TCS cage at C3-4 5 mm Titan TC cage at C4-5 5 mm Titan TC cage at C5-6 6 mm Titan TC cage at C6-7 48 mm Zevo anterior plate 13 mm screws Vivigen morselized allograft  SPECIMENS: None  DRAINS: None  COMPLICATIONS: None immediate  CONDITION: Hemodynamically stable to PACU  HISTORY: This is a 54 year old woman with rheumatoid arthritis and a history of 19 orthopedic surgeries who presented to the  clinic with progressive cervical myelopathy.  She was found to have autofusion of occiput-C1-C2, but also had significant degenerative disc disease with cord impingement and C3-4, C4-5, C5-6, as well as moderate to severe stenosis at C6-7.  She had some cord signal change at C3-4.  The symptoms were progressing despite nonsurgical therapies.  risks, benefits, alternatives, and expected convalescence were discussed with the patient.  Risks discussed included but were not limited to bleeding, pain, infection, pseudoarthrosis, hardware failure, adjacent segment disease, CSF leak, neurologic deficits, weakness, numbness, paralysis, coma, and death. After all questions were answered, informed consent was obtained.  PROCEDURE IN DETAIL: The patient was brought to the operating room and transferred to the operative table. After induction of general anesthesia, the patient was positioned on the operative table in the supine position with all pressure points meticulously padded. The skin of the neck was then prepped and draped in the usual sterile fashion.  After timeout was conducted, the skin was infiltrated with local anesthetic. Skin incision was then made sharply and Bovie electrocautery was used to dissect the subcutaneous tissue until the platysma was identified. The platysma was then divided and undermined. The sternocleidomastoid muscle was then identified and, utilizing natural fascial planes in the neck, the prevertebral fascia was identified and the carotid sheath was retracted laterally and the trachea and esophagus retracted medially. Again using fluoroscopy, the C3-4 disc space was identified. Bovie electrocautery was used to dissect in the subperiosteal plane and elevate the bilateral longus coli muscles off of the C3, C4, C5, C6, and C7 bodies. Self-retaining retractors were then placed. Caspar distraction pins were placed in the adjacent bodies to allow for gentle distraction.  At this point, the  microscope was draped and brought into the field, and the remainder of the case was done under the microscope using microdissecting  technique.  The C3-4 disc space was incised sharply and combination of high speed drill, curettes, and rongeurs were use to initially complete a discectomy. The high-speed drill was then used to complete discectomy until the posterior annulus was identified and removed and the posterior longitudinal ligament was identified.  The patient had a very calcified PLL which needed to be drilled away.  It was very adherent to the dura but using a nerve hook, the PLL was dissected from the dura and elevated.  Kerrison rongeurs were used to remove the posterior longitudinal ligament and the ventral thecal sac was identified. Using a combination of curettes and rongeurs, complete decompression of the thecal sac and exiting nerve roots at this level was completed, and verified with easy passage of micro-nerve hook centrally and in the bilateral foramina.  Having completed our decompression, attention was turned to placement of the intervertebral device. Trial spacers were used to select a size 5 mm graft.  To minimize postoperative dysphagia symptoms, we elected to place a i stand-alone cage with integrated screws.  This graft was then filled with morcellized allograft, and inserted under live fluoroscopy.  Angled awl was used to perform screw tract and 40 mm screws were placed to secure the implant and fused C3-4.  Attention was then turned to the C4-5 level. Caspar distraction pin was placed in the adjacent body to allow for gentle distraction of the disc space.  In a similar fashion, discectomy was completed initially with curettes and rongeurs, and completed with the drill. The PLL was again identified, elevated and incised.  It was similarly very thickened and partially calcified, though not fully ossified like at C3-4.  Using Kerrison rongeurs, decompression of the spinal cord and  exiting roots was completed and confirmed with a dissector. Trial spacers were used to select a 5 mm graft. This graft was then filled with morcellized allograft, and inserted under live fluoroscopy.  Attention was then turned to the C5-6 level. Caspar distraction pin was placed in the adjacent body to allow for gentle distraction of the disc space.  In a similar fashion, discectomy was completed initially with curettes and rongeurs, and completed with the drill. The PLL was again identified, elevated and incised.  It was similarly very thickened and partially calcified, though not fully ossified like at C3-4.  Using Kerrison rongeurs, decompression of the spinal cord and exiting roots was completed and confirmed with a dissector. Trial spacers were used to select a 5 mm graft. This graft was then filled with morcellized allograft, and inserted under live fluoroscopy.  Attention was then turned to the C6-7 level. Caspar distraction pin was placed in the adjacent body to allow for gentle distraction of the disc space.  In a similar fashion, discectomy was completed initially with curettes and rongeurs, and completed with the drill. The PLL was again identified, elevated and incised.  At this level, it was thinner and less calcified than at other levels..  Using Kerrison rongeurs, decompression of the spinal cord and exiting roots was completed and confirmed with a dissector. Trial spacers were used to select a 6 mm graft. This graft was then filled with morcellized allograft, and inserted under live fluoroscopy.  After placement of the intervertebral devices, the caspar pins were removed.  An anterior cervical plate was placed across the interspaces for anterior fixation.  Using a high-speed drill, the cortex of the cervical vertebral bodies was punctured, and screws inserted in the vertebral bodies. Final fluoroscopic images in AP  and lateral projections were taken to confirm good hardware placement.  At  this point, after all counts were verified to be correct, meticulous hemostasis was secured using a combination of bipolar electrocautery and passive hemostatics. The platysma muscle was then closed using interrupted 3-0 Vicryl sutures, and the skin was closed with a 4-0 monocryl in subcutical fashion.  Steri-Strips were then placed.  Sterile dressings were then applied and the drapes removed.  The patient tolerated the procedure well and was extubated in the room and taken to the postanesthesia care unit in stable condition.  All counts were correct at the end of the procedure.

## 2020-10-27 DIAGNOSIS — M4712 Other spondylosis with myelopathy, cervical region: Secondary | ICD-10-CM | POA: Diagnosis not present

## 2020-10-27 MED ORDER — DOCUSATE SODIUM 100 MG PO CAPS
100.0000 mg | ORAL_CAPSULE | Freq: Two times a day (BID) | ORAL | 2 refills | Status: DC
Start: 2020-10-27 — End: 2022-01-15

## 2020-10-27 MED ORDER — HYDROCODONE-ACETAMINOPHEN 10-325 MG PO TABS
1.0000 | ORAL_TABLET | ORAL | 0 refills | Status: DC | PRN
Start: 2020-10-27 — End: 2022-01-15

## 2020-10-27 NOTE — Evaluation (Signed)
Physical Therapy Evaluation Patient Details Name: Diana Peters MRN: [id number] DOB: February 14, 1966 Today's Date: 10/27/2020   History of Present Illness  The pt is a 54 yo female presenting s/p ACDF C3-7 on 11/17 due to weakness and numbness in bilateral hands as well as difficulty with walking and coordination. PMH includes: rheumatoid arthritis, and 19 orthopedic surgeries including: bilateral TKA, THA, shoulder replacement, wrist arthroplasty, elbow replacement, and mandible reconstruction.   Clinical Impression  Pt in bed upon arrival of PT, agreeable to evaluation at this time. Prior to admission the pt was completely independent, living alone and working from home. The pt has had increased difficulty with UE weakness and numbness, as well as a constant dizziness that gets progressively worse with mobility. The pt now presents with limitations in functional mobility, strength, and stability with mobility due to above dx and prior dizziness, and will continue to benefit from skilled PT to address these deficits. The pt was able to complete initial transfers and gait without physical assist, but demos increased lateral movement and sway that benefits from minG for safety at this time. The pt also reports her dizziness remains, is worse with gait, and became significantly worse requiring min/modA to steady when completing turns in the hallway. The pt will continue to benefit from skilled PT acutely and following d/c to maximize safety and independence with mobility following d/c.      Follow Up Recommendations Outpatient PT;Supervision for mobility/OOB (OP neuro PT, vestibular eval?)    Equipment Recommendations  None recommended by PT    Recommendations for Other Services       Precautions / Restrictions Precautions Precautions: Cervical;Fall Precaution Booklet Issued: Yes (comment) Precaution Comments: has vertical diplopia Required Braces or Orthoses: Cervical Brace Cervical  Brace: Hard collar (off for showers, on in sitting) Restrictions Weight Bearing Restrictions: No      Mobility  Bed Mobility Overal bed mobility: Needs Assistance Bed Mobility: Rolling;Sidelying to Sit Rolling: Modified independent (Device/Increase time) Sidelying to sit: Supervision (VC for technique; heavy use of rails)       General bed mobility comments: pt sitting EOB upon arrival of PT    Transfers Overall transfer level: Needs assistance Equipment used: 1 person hand held assist Transfers: Sit to/from Stand Sit to Stand: Min guard         General transfer comment: minG to stand, very light HHA, pt able to steady without assist once in standing  Ambulation/Gait Ambulation/Gait assistance: Min guard Gait Distance (Feet): 150 Feet Assistive device: None Gait Pattern/deviations: Step-through pattern;Decreased stance time - right;Decreased weight shift to right   Gait velocity interpretation: <1.8 ft/sec, indicate of risk for recurrent falls General Gait Details: pt with good stability, nut with gait deviations that she claims are basline for her. increased stance time on LLE. significant lateral movement with gait.      Balance Overall balance assessment: Needs assistance Sitting-balance support: No upper extremity supported Sitting balance-Leahy Scale: Good     Standing balance support: No upper extremity supported Standing balance-Leahy Scale: Fair                               Pertinent Vitals/Pain Pain Assessment: 0-10 Pain Score: 6  Pain Location: neck; "overall" Pain Descriptors / Indicators: Aching;Discomfort;Grimacing Pain Intervention(s): Limited activity within patient's tolerance;Monitored during session;Repositioned    Home Living Family/patient expects to be discharged to:: Private residence Living Arrangements: Alone;Parent (mom coming to stay as long  as needed) Available Help at Discharge: Family;Available 24 hours/day Type of  Home: Other(Comment) (condo) Home Access: Ramped entrance;Elevator     Home Layout: One level Home Equipment: Shower seat - built in;Grab bars - tub/shower;Adaptive equipment Additional Comments: pt works from home    Prior Function Level of Independence: Independent with assistive device(s)         Comments: Has adapted ways of completing tasks due to RA     Hand Dominance   Dominant Hand: Right    Extremity/Trunk Assessment   Upper Extremity Assessment Upper Extremity Assessment: Defer to OT evaluation RUE Deficits / Details: multiple MP/IP joint replacements; shoulder replacement; limited AROM at baseline; pt reports she is at baseline with ROM and that the pain is improving RUE Sensation:  (pins/needles) RUE Coordination: decreased fine motor;decreased gross motor LUE Deficits / Details: similar to R    Lower Extremity Assessment Lower Extremity Assessment: Overall WFL for tasks assessed (WFL on MMT, no reports of difference in sensation bilaterall)    Cervical / Trunk Assessment Cervical / Trunk Assessment: Other exceptions Cervical / Trunk Exceptions: s/p cervical surgery  Communication   Communication: No difficulties  Cognition Arousal/Alertness: Awake/alert Behavior During Therapy: WFL for tasks assessed/performed Overall Cognitive Status: Within Functional Limits for tasks assessed                                        General Comments General comments (skin integrity, edema, etc.): complains of dizziness with all mobility, worse with gait than sitting, absolute worst with turning    Exercises     Assessment/Plan    PT Assessment Patient needs continued PT services  PT Problem List Decreased strength;Decreased activity tolerance;Decreased balance;Decreased mobility;Decreased coordination       PT Treatment Interventions DME instruction;Gait training;Stair training;Functional mobility training;Therapeutic activities;Therapeutic  exercise;Balance training;Neuromuscular re-education;Patient/family education    PT Goals (Current goals can be found in the Care Plan section)  Acute Rehab PT Goals Patient Stated Goal: to return to being independent PT Goal Formulation: With patient Time For Goal Achievement: 11/10/20 Potential to Achieve Goals: Good    Frequency Min 5X/week    AM-PAC PT "6 Clicks" Mobility  Outcome Measure Help needed turning from your back to your side while in a flat bed without using bedrails?: None Help needed moving from lying on your back to sitting on the side of a flat bed without using bedrails?: None Help needed moving to and from a bed to a chair (including a wheelchair)?: A Little Help needed standing up from a chair using your arms (e.g., wheelchair or bedside chair)?: A Little Help needed to walk in hospital room?: A Little Help needed climbing 3-5 steps with a railing? : A Lot 6 Click Score: 19    End of Session Equipment Utilized During Treatment: Gait belt;Cervical collar Activity Tolerance: Patient tolerated treatment well Patient left: in bed;with call bell/phone within reach (sitting EOB) Nurse Communication: Mobility status PT Visit Diagnosis: Other abnormalities of gait and mobility (R26.89);Muscle weakness (generalized) (M62.81)    Time: [phone] ( time out for 10 min due to MD arrival/discussion) PT Time Calculation (min) (ACUTE ONLY): 24 min   Charges:   PT Evaluation $PT Eval Moderate Complexity: 1 Mod          Rolm Baptise, PT, DPT   Acute Rehabilitation Department Pager #: [phone]  Gaetana Michaelis 10/27/2020, 11:38  AM

## 2020-10-27 NOTE — Progress Notes (Signed)
Occupational Therapy Evaluation Patient Details Name: Diana Peters MRN: [id number] DOB: Jun 07, 1966 Today's Date: 10/27/2020    History of Present Illness 54 y.o. female with rheumatoid arthritis and 19 orthopedic surgeries related to this who presented to clinic with progressive weakness and numbness in the hands as well as difficulty with walking and coordination.  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 Underwent ACDF C34, C45, C56, C67.    Clinical Impression   PTA, pt lived alone and was modified independent with ADL and mobility. Pt currently requires min A with ADL tasks. Educated pt on cervical precautions, use of compensatory strategies and use of AE/DME to maximize independence. Pt's mother plans to help "as long as needed" after DC. 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 Partial occlusion used on L nasal field to improve functional vision - pt reports the taping significantly helps. Recommend follow up with OT at the neuro outpt center to further address ADL & IADL tasks in addition to further assessing availabilty AE to help pt with her RA and following up with use of partial occlusion. Discussed DC needs with CM.     Follow Up Recommendations  Outpatient OT;Supervision - Intermittent (neuro outpt)    Equipment Recommendations  None recommended by OT    Recommendations for Other Services       Precautions / Restrictions Precautions Precautions: Cervical;Fall Precaution Booklet Issued: Yes (comment) Precaution Comments: has vertical diplopia Required Braces or Orthoses: Cervical Brace Cervical Brace: Hard collar (off for showers, when sitting) Restrictions Weight Bearing Restrictions: No      Mobility Bed Mobility Overal bed mobility: Needs Assistance Bed Mobility: Rolling;Sidelying to Sit Rolling: Modified independent  (Device/Increase time) Sidelying to sit: Supervision (VC for technique; heavy use of rails)            Transfers Overall transfer level: Needs assistance   Transfers: Sit to/from Stand Sit to Stand: Supervision              Balance Overall balance assessment: Needs assistance   Sitting balance-Leahy Scale: Good       Standing balance-Leahy Scale: Fair                             ADL either performed or assessed with clinical judgement   ADL Overall ADL's : Needs assistance/impaired   Eating/Feeding Details (indicate cue type and reason): uses adapted utnesils                                 Functional mobility during ADLs: Min guard General ADL Comments: Pt requires min A for LB ADL. Educated on use of AE and compensatory strategies. Pt able to return demonstrate. States her Mom will be able to assist at DC. Has necessary AE at home for LB ADL and pericare. Educated on use of collar and donning/doffing collar for ADL tasks. Pt verbalzied understanding.     Vision Baseline Vision/History: Wears glasses Wears Glasses: Reading only Patient Visual Report: Diplopia Vision Assessment?: Yes Diplopia Assessment: Objects split on top of one another;Present in near gaze;Present in far gaze;Only with right gaze;Present in primary gaze Additional Comments: Pt reports vertical in R inferior gaze which estinguishes with partial occlusion. Pt has been compensating when watching TV, reading, driving by closing 1 eye. Pt  appears to be R eye dominant. Nasal field of L eye partially occluded to decrease double vision and increase funcitonal vision. Pt educated on taping technique. Recommend pt follow up with an eye doctor to further assess.      Perception     Praxis      Pertinent Vitals/Pain Pain Assessment: 0-10 Pain Score: 7  Pain Location: neck; "overall" Pain Descriptors / Indicators: Aching;Discomfort;Grimacing Pain Intervention(s): Limited  activity within patient's tolerance;RN gave pain meds during session     Hand Dominance Right   Extremity/Trunk Assessment Upper Extremity Assessment Upper Extremity Assessment: RUE deficits/detail;LUE deficits/detail RUE Deficits / Details: multiple MP/IP joint replacements; shoulder replacement; limited AROM at baseline; pt reports she is at baseline with ROM and that the pain is improving RUE Sensation:  (pins/needles) RUE Coordination: decreased fine motor;decreased gross motor LUE Deficits / Details: similar to R   Lower Extremity Assessment Lower Extremity Assessment: Defer to PT evaluation   Cervical / Trunk Assessment Cervical / Trunk Assessment: Other exceptions (cervical sx)   Communication Communication Communication: No difficulties   Cognition Arousal/Alertness: Awake/alert Behavior During Therapy: WFL for tasks assessed/performed Overall Cognitive Status: Within Functional Limits for tasks assessed                                     General Comments  Complains of dizziness, especially with positional changes; ? BPPV    Exercises     Shoulder Instructions      Home Living Family/patient expects to be discharged to:: Private residence Living Arrangements: Alone;Parent (Mom staying as "long as needed") Available Help at Discharge: Family;Available 24 hours/day Type of Home: Other(Comment) (Condo) Home Access: Ramped entrance;Elevator     Home Layout: One level     Bathroom Shower/Tub: Producer, television/film/video: Standard Bathroom Accessibility: Yes How Accessible: Accessible via walker (sideways) Home Equipment: Shower seat - built in;Grab bars - tub/shower;Adaptive equipment Adaptive Equipment: Reacher;Sock aid        Prior Functioning/Environment Level of Independence: Independent with assistive device(s)        Comments: Has adapted ways of completing tasks due to RA        OT Problem List: Decreased  strength;Decreased range of motion;Impaired balance (sitting and/or standing);Impaired vision/perception;Decreased coordination;Decreased knowledge of use of DME or AE;Decreased safety awareness;Decreased knowledge of precautions;Impaired sensation;Impaired UE functional use;Pain      OT Treatment/Interventions:      OT Goals(Current goals can be found in the care plan section) Acute Rehab OT Goals Patient Stated Goal: to return to being independent OT Goal Formulation: All assessment and education complete, DC therapy  OT Frequency:     Barriers to D/C:            Co-evaluation              AM-PAC OT "6 Clicks" Daily Activity     Outcome Measure Help from another person eating meals?: A Little Help from another person taking care of personal grooming?: A Little Help from another person toileting, which includes using toliet, bedpan, or urinal?: A Little Help from another person bathing (including washing, rinsing, drying)?: A Little Help from another person to put on and taking off regular upper body clothing?: A Little Help from another person to put on and taking off regular lower body clothing?: A Little 6 Click Score: 18   End of Session Equipment Utilized During Treatment: Cervical  collar Nurse Communication: Mobility status;Other (comment) (DC needs)  Activity Tolerance: Patient tolerated treatment well Patient left: in bed;with call bell/phone within reach  OT Visit Diagnosis: Unsteadiness on feet (R26.81);Other abnormalities of gait and mobility (R26.89);Muscle weakness (generalized) (M62.81);Pain;Dizziness and giddiness (R42);Other (comment) (diplopia) Pain - part of body:  (neck; "all over")                Time: 6063-0160 OT Time Calculation (min): 35 min Charges:  OT General Charges $OT Visit: 1 Visit OT Evaluation $OT Eval Low Complexity: 1 Low OT Treatments $Self Care/Home Management : 8-22 mins  Luisa Dago, OT/L   Acute OT Clinical Specialist Acute  Rehabilitation Services Pager [phone] Office [phone]   [facility] 10/27/2020, 9:37 AM

## 2020-10-27 NOTE — Progress Notes (Signed)
Patient is discharged from room 3C07 at this time. Alert and in stable condition. IV site d/c'd and instructions read to patient and mother with understanding verbalized and all questions answered. Left unit via wheelchair with all belongings at side. 

## 2020-10-27 NOTE — Discharge Instructions (Signed)

## 2020-10-27 NOTE — Discharge Summary (Signed)
  Physician Discharge Summary  Patient ID: Diana Peters MRN: [id number] DOB/AGE: Apr 12, 1966 54 y.o.  Admit date: 10/26/2020 Discharge date: 10/27/2020  Admission Diagnoses:  Cervical stenosis with myelopathy  Discharge Diagnoses:  Same Active Problems:   Stenosis of cervical spine with myelopathy [facility])   Discharged Condition: Stable  Hospital Course:  Diana Peters is a 54 y.o. female with RA and numerous orthopedic surgeries who presented to the clinic with progressive myelopathy symptoms.  She was found to have severe cervical stenosis from C3-7 with cord signal change.  She underwent elective C3-4, C4-5, C5-6, and C6-7 ACDF.  Postoperatively, she was admitted to the spine unit where she was mobilized with the help of therapy.  Her pain was well controlled with p.o. pain medications.  She was tolerating a soft diet.  She was voiding without difficulty.  She was deemed ready for discharge home on 10/27/2020.  Treatments: Surgery - C3-4, C4-5, C5-6, and C6-7 ACDFs  Discharge Exam: Blood pressure 114/68, pulse 76, temperature 98.1 F (36.7 C), temperature source Oral, resp. rate 16, height 5' (1.524 m), weight 47.2 kg, SpO2 100 %. Awake, alert, oriented Speech fluent, appropriate CN grossly intact C-collar in place, dressing clean dry and intact No significant voice hoarseness Hands with multiple well-healed surgical incisions.  Hand strength 4/5.  Decreased hand sensation, but improved compared with preoperatively.  Wide-based gait.  Disposition: Discharge disposition: 01-Home or Self Care       Discharge Instructions    Incentive spirometry RT   Complete by: As directed         Signed: Bedelia Person 10/27/2020, 9:21 AM

## 2020-10-27 NOTE — Anesthesia Postprocedure Evaluation (Signed)
Anesthesia Post Note  Patient: Chantell Kunkler  Procedure(s) Performed: ANTERIOR CERVICAL DECOMPRESSION/DISCECTOMY FUSION CERVICAL THREE-FOUR, CERVICAL FOUR-FIVE, CERVICAL FIVE-SIX, CERVICAL SIX-SEVEN (N/A )     Patient location during evaluation: PACU Anesthesia Type: General Level of consciousness: awake and alert Pain management: pain level controlled Vital Signs Assessment: post-procedure vital signs reviewed and stable Respiratory status: spontaneous breathing, nonlabored ventilation, respiratory function stable and patient connected to nasal cannula oxygen Cardiovascular status: blood pressure returned to baseline and stable Postop Assessment: no apparent nausea or vomiting Anesthetic complications: no   No complications documented.          Shelton Silvas

## 2020-10-27 NOTE — TOC Progression Note (Signed)
Transition of Care [facility]) - Progression Note    Patient Details  Name: Diana Peters MRN: [id number] Date of Birth: 1966/10/22  Transition of [facility]) CM/SW Contact  Beckie Busing, RN Phone Number: [phone]  10/27/2020, 9:33 AM  Clinical Narrative:    Outpatient rehab has been set up and info added to avs.       Expected Discharge Plan and Services           Expected Discharge Date: 10/27/20                                     Social Determinants of Health (SDOH) Interventions    Readmission Risk Interventions No flowsheet data found.

## 2020-10-28 ENCOUNTER — Encounter (HOSPITAL_COMMUNITY): Payer: Self-pay | Admitting: Neurosurgery

## 2021-06-07 ENCOUNTER — Encounter (HOSPITAL_COMMUNITY): Payer: Self-pay | Admitting: *Deleted

## 2021-12-26 ENCOUNTER — Other Ambulatory Visit: Payer: Self-pay | Admitting: Internal Medicine

## 2021-12-26 DIAGNOSIS — Z1231 Encounter for screening mammogram for malignant neoplasm of breast: Secondary | ICD-10-CM

## 2022-01-08 ENCOUNTER — Ambulatory Visit

## 2022-01-15 ENCOUNTER — Encounter: Payer: Self-pay | Admitting: Gastroenterology

## 2022-01-15 ENCOUNTER — Ambulatory Visit (INDEPENDENT_AMBULATORY_CARE_PROVIDER_SITE_OTHER): Admitting: Gastroenterology

## 2022-01-15 VITALS — BP 118/62 | HR 91 | Ht 61.0 in | Wt 109.6 lb

## 2022-01-15 DIAGNOSIS — R1310 Dysphagia, unspecified: Secondary | ICD-10-CM | POA: Diagnosis not present

## 2022-01-15 NOTE — Progress Notes (Signed)
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 West Carbo Cervical spine surgery in [place] in 2021 Intermittent heartburn, given frequency patient does not feel daily meds are needed  PLAN: Barium esophagram to clarify the anatomy Upper endoscopy with possible dilation following the esophagram Obtain colonoscopy report from [facility] in 2018  I consented the patient discussing the risks, benefits, and alternatives to endoscopic evaluation. She is very interested in esophageal dilation even if endoscopically the lumen appears normal given her Mom's experience with dilation for similar symptoms.  HPI: Diana Peters is a 56 y.o. female referred by Dr. West Carbo for further evaluation of dysphagia.  The history is obtained through the patient and review of her electronic health record.  She has rheumatoid arthritis treated with DMARDs and TNF inhibitor therapy, anxiety, arthritis, depression, and kidney stones. No history of allergies or asthma.  She has had intermittent solid food dysphagia for over 25 hours. Localizes the dysphagia to the cervical esophagus. Will use sips of liquids to pass the bolus. She has had to induced regurgitation to avoid food impaction.  Had a barium swallow at [facility] about 20 years ago suggesting a web.  Esophageal dilation was discussed but never performed.  She saw with Dr. West Carbo who performed a flexible laryngoscopy 01/04/2022 that was essentially normal.  He recommended referral to gastroenterology.   She has a history of heartburn but none in several months. Symptoms do not happen often enough to require treatment.   No odynophagia or globus. No hoarseness or neurologic changes. No delay  in swallow initiation, nasal regurgitation, swallowing associated cough, or repetitive swallowing needed to clear the bolus.    She had cervical spine surgery in [place] in 2021. No change in swallowing after the procedure.   She had a screening colonoscopy [facility] in 2018 that she remembers being normal.   Mother had an esophageal dilation for dysphagia. Paternal aunt who is her father's half-sister was diagnosed with colon cancer in her 33s. There is no other known family history of colon cancer or polyps. No family history of stomach cancer or other GI malignancy. No family history of inflammatory bowel disease or celiac.    Past Medical History:  Diagnosis Date   Arthritis    RA   Heart murmur    Echo completed 5 years ago  - [facility] - report stated everything was normal   Rheumatoid arthritis ([place])    Rheumatoid arthritis [facility])     Past Surgical History:  Procedure Laterality Date   ANTERIOR CERVICAL DECOMPRESSION/DISCECTOMY FUSION 4 LEVELS N/A 10/26/2020   Procedure: ANTERIOR CERVICAL DECOMPRESSION/DISCECTOMY FUSION CERVICAL THREE-FOUR, CERVICAL FOUR-FIVE, CERVICAL FIVE-SIX, CERVICAL SIX-SEVEN;  Surgeon: Vallarie Mare, MD;  Location: [facility];  Service: Neurosurgery;  Laterality: N/A;   BREAST SURGERY Bilateral    augmentation   CESAREAN SECTION     x2   COLONOSCOPY     ESOPHAGOGASTRODUODENOSCOPY     finger joint replacementt Left    joint replacements of pointer and second finger   great toe fusion     great toe surgery Right    fusion right great toe   JOINT REPLACEMENT     MANDIBLE RECONSTRUCTION  REPLACEMENT TOTAL KNEE Bilateral    REVISION TOTAL HIP ARTHROPLASTY     bilat   REVISION TOTAL HIP ARTHROPLASTY Bilateral    TOTAL ANKLE ARTHROPLASTY     bilat   TOTAL ANKLE REPLACEMENT Bilateral    TOTAL ELBOW ARTHROPLASTY     bilat   TOTAL ELBOW REPLACEMENT Bilateral    TOTAL HIP ARTHROPLASTY     bilat   TOTAL HIP ARTHROPLASTY  Bilateral    TOTAL KNEE ARTHROPLASTY     bilat   TOTAL SHOULDER ARTHROPLASTY     bilateral   TOTAL SHOULDER REPLACEMENT Bilateral    TOTAL WRIST ARTHROPLASTY Right    UPPER GASTROINTESTINAL ENDOSCOPY     WISDOM TOOTH EXTRACTION     WRIST FUSION     Current Outpatient Medications  Medication Sig Dispense Refill   Calcium-Vitamin D-Vitamin K (VIACTIV PO) Take 1 tablet by mouth daily.     certolizumab pegol (CIMZIA) 2 X 200 MG KIT Inject 200 mg into the skin every 14 (fourteen) days.     ferrous sulfate 325 (65 FE) MG tablet Take 325 mg by mouth daily with breakfast.     HYDROcodone-acetaminophen (NORCO) 10-325 MG tablet Take 1 tablet by mouth every 6 (six) hours as needed for pain.     Meloxicam (MOBIC PO) Take by mouth.     methylcellulose oral powder Take 1 packet by mouth daily.     Multiple Vitamin (MULTIVITAMIN WITH MINERALS) TABS tablet Take 1 tablet by mouth daily.     solifenacin (VESICARE) 10 MG tablet Take 10 mg by mouth daily.     No current facility-administered medications for this visit.    Allergies as of 01/15/2022   (No Known Allergies)    Family History  Problem Relation Age of Onset   Breast cancer Maternal Aunt    Breast cancer Maternal Grandmother    Breast cancer Maternal Aunt    Healthy Mother    Healthy Father     Social History   Socioeconomic History   Marital status: Divorced    Spouse name: Not on file   Number of children: Not on file   Years of education: Not on file   Highest education level: Not on file  Occupational History   Not on file  Tobacco Use   Smoking status: Never   Smokeless tobacco: Never  Vaping Use   Vaping Use: Never used  Substance and Sexual Activity   Alcohol use: Yes    Comment: very rarely   Drug use: Never   Sexual activity: Not Currently  Other Topics Concern   Not on file  Social History Narrative   ** Merged History Encounter **       Social Determinants of Health   Financial Resource Strain: Not  on file  Food Insecurity: Not on file  Transportation Needs: Not on file  Physical Activity: Not on file  Stress: Not on file  Social Connections: Not on file  Intimate Partner Violence: Not on file     Physical Exam: General:   Alert,  well-nourished, pleasant and cooperative in NAD Head:  Normocephalic and atraumatic. Eyes:  Sclera clear, no icterus.   Conjunctiva pink. Ears:  Normal auditory acuity. Nose:  No deformity, discharge,  or lesions. Mouth:  No deformity or lesions.   Neck:  Supple; no masses or thyromegaly. Lungs:  Clear throughout to auscultation.   No wheezes. Heart:  Regular rate and rhythm; no murmurs. Abdomen:  Soft, nontender, nondistended, normal bowel sounds,  no rebound or guarding. No hepatosplenomegaly.   Rectal:  Deferred  Msk:  Symmetrical. No boney deformities LAD: No inguinal or umbilical LAD Extremities:  No clubbing or edema. RA boney deformities of both hands.  Neurologic:  Alert and  oriented x4;  grossly nonfocal Skin:  Intact without significant lesions or rashes. Psych:  Alert and cooperative. Normal mood and affect.     Aniela Caniglia L. Tarri Glenn, MD, MPH 01/15/2022, 11:10 AM

## 2022-01-15 NOTE — Patient Instructions (Addendum)
It was my pleasure to provide care to you today. Based on our discussion, I am providing you with my recommendations below:  RECOMMENDATION(S):   Imaging:  You have been scheduled for a Barium Esophogram at [facility]iology (1st floor of the hospital) on Tuesday 01/23/22 at 10:30 am. Please arrive 15 minutes prior to your appointment for registration. Make certain not to have anything to eat or drink 3 hours prior to your test. If you need to reschedule for any reason, please contact radiology at [phone] to do so. __________________________________________________________________ A barium swallow is an examination that concentrates on views of the esophagus. This tends to be a double contrast exam (barium and two liquids which, when combined, create a gas to distend the wall of the oesophagus) or single contrast (non-ionic iodine based). The study is usually tailored to your symptoms so a good history is essential. Attention is paid during the study to the form, structure and configuration of the esophagus, looking for functional disorders (such as aspiration, dysphagia, achalasia, motility and reflux) EXAMINATION You may be asked to change into a gown, depending on the type of swallow being performed. A radiologist and radiographer will perform the procedure. The radiologist will advise you of the type of contrast selected for your procedure and direct you during the exam. You will be asked to stand, sit or lie in several different positions and to hold a small amount of fluid in your mouth before being asked to swallow while the imaging is performed .In some instances you may be asked to swallow barium coated marshmallows to assess the motility of a solid food bolus. The exam can be recorded as a digital or video fluoroscopy procedure. POST PROCEDURE It will take 1-2 days for the barium to pass through your system. To facilitate this, it is important, unless otherwise directed, to increase  your fluids for the next 24-48hrs and to resume your normal diet.  This test typically takes about 30 minutes to perform.  ENDOSCOPY:   You have been scheduled for an endoscopy. Please follow written instructions given to you at your visit today.  INHALERS:   If you use inhalers (even only as needed), please bring them with you on the day of your procedure.  FOLLOW UP:  I would like for you to follow up with me in . Please call the office at [phone] to schedule your appointment. After your procedure, you will receive a call from my office staff regarding my recommendation for follow up.  BMI:  If you are age 80 or older, your body mass index should be between 23-30. Your Body mass index is 20.71 kg/m. If this is out of the aforementioned range listed, please consider follow up with your Primary Care Provider.  If you are age 57 or younger, your body mass index should be between 19-25. Your Body mass index is 20.71 kg/m. If this is out of the aformentioned range listed, please consider follow up with your Primary Care Provider.   MY CHART:  The [place] [facility] providers would like to encourage you to use [facility] to communicate with providers for non-urgent requests or questions.  Due to long hold times on the telephone, sending your provider a message by [facility] may be a faster and more efficient way to get a response.  Please allow 48 business hours for a response.  Please remember that this is for non-urgent requests.   Thank you for trusting me with your gastrointestinal care!  Tressia Danas, MD, MPH

## 2022-01-23 ENCOUNTER — Ambulatory Visit (HOSPITAL_COMMUNITY)
Admission: RE | Admit: 2022-01-23 | Discharge: 2022-01-23 | Disposition: A | Discharge status: Home / Self Care | Source: Ambulatory Visit | Attending: Gastroenterology | Admitting: Gastroenterology

## 2022-01-23 ENCOUNTER — Other Ambulatory Visit: Payer: Self-pay | Admitting: Gastroenterology

## 2022-01-23 ENCOUNTER — Other Ambulatory Visit: Payer: Self-pay

## 2022-01-23 DIAGNOSIS — R1310 Dysphagia, unspecified: Secondary | ICD-10-CM

## 2022-01-25 ENCOUNTER — Other Ambulatory Visit: Payer: Self-pay | Admitting: Internal Medicine

## 2022-01-25 DIAGNOSIS — R42 Dizziness and giddiness: Secondary | ICD-10-CM

## 2022-01-31 ENCOUNTER — Other Ambulatory Visit: Payer: Self-pay

## 2022-01-31 DIAGNOSIS — R1310 Dysphagia, unspecified: Secondary | ICD-10-CM

## 2022-01-31 MED ORDER — PANTOPRAZOLE SODIUM 40 MG PO TBEC: 40.0000 mg | DELAYED_RELEASE_TABLET | Freq: Two times a day (BID) | ORAL | 0 refills | Status: DC

## 2022-02-16 ENCOUNTER — Encounter: Payer: Self-pay | Admitting: Gastroenterology

## 2022-02-16 ENCOUNTER — Ambulatory Visit (AMBULATORY_SURGERY_CENTER): Admitting: Gastroenterology

## 2022-02-16 ENCOUNTER — Other Ambulatory Visit: Payer: Self-pay

## 2022-02-16 VITALS — BP 114/65 | HR 80 | Temp 98.9°F | Resp 10 | Ht 61.0 in | Wt 109.0 lb

## 2022-02-16 DIAGNOSIS — K222 Esophageal obstruction: Secondary | ICD-10-CM | POA: Diagnosis not present

## 2022-02-16 DIAGNOSIS — R1310 Dysphagia, unspecified: Secondary | ICD-10-CM | POA: Diagnosis present

## 2022-02-16 MED ORDER — SODIUM CHLORIDE 0.9 % IV SOLN: 500.0000 mL | Freq: Once | INTRAVENOUS | Status: DC

## 2022-02-16 NOTE — Progress Notes (Signed)
? ?Referring Provider: Michael Boston, MD ?Primary Care Physician:  Michael Boston, MD ? ? ?Indication for Procedure: Dysphagia ? ? 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   - localized to the cervical esophagus ?   - normal flexible laryngoscopy 01/04/2022 with Dr. West Carbo ?Cervical spine surgery in [place] in 2021 ?Intermittent heartburn, given frequency patient does not feel daily meds are needed ?Appropriate candidate for monitored anesthesia care in the [facility] ? ?PLAN: ?Upper endoscopy with possible dilation  ? ?HPI: Diana Peters is a 56 y.o. female for endoscopic evaluation of dysphagia.  She has rheumatoid arthritis treated with DMARDs and TNF inhibitor therapy, anxiety, arthritis, depression, and kidney stones. No history of allergies or asthma. ? ?She has had intermittent solid food dysphagia for over 25 hours. Localizes the dysphagia to the cervical esophagus. Will use sips of liquids to pass the bolus. She has had to induced regurgitation to avoid food impaction.  Had a barium swallow at [facility] about 20 years ago suggesting a web.  Esophageal dilation was discussed but never performed.  She saw with Dr. West Carbo who performed a flexible laryngoscopy 01/04/2022 that was essentially normal.  He recommended referral to gastroenterology.  ? ?She has a history of heartburn but none in several months. Symptoms do not happen often enough to require treatment.  ? ?No odynophagia or globus. No hoarseness or neurologic changes. No delay in swallow initiation, nasal regurgitation, swallowing associated cough, or repetitive swallowing needed to clear the bolus.   ? ?She had cervical spine surgery in [place] in 2021. No change in swallowing after the procedure.  ? ?She had a screening colonoscopy at [facility] in 2018 that she remembers being normal.  ? ?Mother had an esophageal dilation for dysphagia. Paternal aunt who is her father's half-sister was  diagnosed with colon cancer in her 26s. There is no other known family history of colon cancer or polyps. No family history of stomach cancer or other GI malignancy. No family history of inflammatory bowel disease or celiac.  ? ? ?Past Medical History:  ?Diagnosis Date  ? Arthritis   ? RA  ? Heart murmur   ? Echo completed 5 years ago  - [facility] - report stated everything was normal  ? Rheumatoid arthritis ([place])   ? Rheumatoid arthritis ([place])   ? ? ?Past Surgical History:  ?Procedure Laterality Date  ? ANTERIOR CERVICAL DECOMPRESSION/DISCECTOMY FUSION 4 LEVELS N/A 10/26/2020  ? Procedure: ANTERIOR CERVICAL DECOMPRESSION/DISCECTOMY FUSION CERVICAL THREE-FOUR, CERVICAL FOUR-FIVE, CERVICAL FIVE-SIX, CERVICAL SIX-SEVEN;  Surgeon: Vallarie Mare, MD;  Location: [place];  Service: Neurosurgery;  Laterality: N/A;  ? BREAST SURGERY Bilateral   ? augmentation  ? CESAREAN SECTION    ? x2  ? COLONOSCOPY    ? ESOPHAGOGASTRODUODENOSCOPY    ? finger joint replacementt Left   ? joint replacements of pointer and second finger  ? great toe fusion    ? great toe surgery Right   ? fusion right great toe  ? JOINT REPLACEMENT    ? MANDIBLE RECONSTRUCTION    ? REPLACEMENT TOTAL KNEE Bilateral   ? REVISION TOTAL HIP ARTHROPLASTY    ? bilat  ? REVISION TOTAL HIP ARTHROPLASTY Bilateral   ? TOTAL ANKLE ARTHROPLASTY    ? bilat  ? TOTAL ANKLE REPLACEMENT Bilateral   ? TOTAL ELBOW ARTHROPLASTY    ? bilat  ? TOTAL ELBOW REPLACEMENT Bilateral   ?  TOTAL HIP ARTHROPLASTY    ? bilat  ? TOTAL HIP ARTHROPLASTY Bilateral   ? TOTAL KNEE ARTHROPLASTY    ? bilat  ? TOTAL SHOULDER ARTHROPLASTY    ? bilateral  ? TOTAL SHOULDER REPLACEMENT Bilateral   ? TOTAL WRIST ARTHROPLASTY Right   ? UPPER GASTROINTESTINAL ENDOSCOPY    ? WISDOM TOOTH EXTRACTION    ? WRIST FUSION    ? ?Current Outpatient Medications  ?Medication Sig Dispense Refill  ? Calcium-Vitamin D-Vitamin K (VIACTIV PO) Take 1 tablet by mouth daily.    ?  HYDROcodone-acetaminophen (NORCO) 10-325 MG tablet Take 1 tablet by mouth every 6 (six) hours as needed for pain.    ? Meloxicam (MOBIC PO) Take by mouth.    ? methylcellulose oral powder Take 1 packet by mouth daily.    ? Multiple Vitamin (MULTIVITAMIN WITH MINERALS) TABS tablet Take 1 tablet by mouth daily.    ? pantoprazole (PROTONIX) 40 MG tablet Take 1 tablet (40 mg total) by mouth 2 (two) times daily. 60 tablet 0  ? solifenacin (VESICARE) 10 MG tablet Take 10 mg by mouth daily.    ? certolizumab pegol (CIMZIA) 2 X 200 MG KIT Inject 200 mg into the skin every 14 (fourteen) days.    ? ferrous sulfate 325 (65 FE) MG tablet Take 325 mg by mouth daily with breakfast.    ? ?Current Facility-Administered Medications  ?Medication Dose Route Frequency Provider Last Rate Last Admin  ? 0.9 %  sodium chloride infusion  500 mL Intravenous Once Thornton Park, MD      ? ? ?Allergies as of 02/16/2022  ? (No Known Allergies)  ? ? ?Family History  ?Problem Relation Age of Onset  ? Breast cancer Maternal Aunt   ? Breast cancer Maternal Grandmother   ? Breast cancer Maternal Aunt   ? Healthy Mother   ? Healthy Father   ? ? ?Social History  ? ?Socioeconomic History  ? Marital status: Divorced  ?  Spouse name: Not on file  ? Number of children: Not on file  ? Years of education: Not on file  ? Highest education level: Not on file  ?Occupational History  ? Not on file  ?Tobacco Use  ? Smoking status: Never  ? Smokeless tobacco: Never  ?Vaping Use  ? Vaping Use: Never used  ?Substance and Sexual Activity  ? Alcohol use: Yes  ?  Comment: very rarely  ? Drug use: Never  ? Sexual activity: Not Currently  ?Other Topics Concern  ? Not on file  ?Social History Narrative  ? ** Merged History Encounter **  ?    ? ?Social Determinants of Health  ? ?Financial Resource Strain: Not on file  ?Food Insecurity: Not on file  ?Transportation Needs: Not on file  ?Physical Activity: Not on file  ?Stress: Not on file  ?Social Connections: Not on  file  ?Intimate Partner Violence: Not on file  ? ? ? ?Physical Exam: ?General:   Alert,  well-nourished, pleasant and cooperative in NAD ?Head:  Normocephalic and atraumatic. ?Eyes:  Sclera clear, no icterus.   Conjunctiva pink. ?Ears:  Normal auditory acuity. ?Nose:  No deformity, discharge,  or lesions. ?Mouth:  No deformity or lesions.   ?Neck:  Supple; no masses or thyromegaly. ?Lungs:  Clear throughout to auscultation.   No wheezes. ?Heart:  Regular rate and rhythm; no murmurs. ?Abdomen:  Soft, nontender, nondistended, normal bowel sounds, no rebound or guarding. No hepatosplenomegaly.   ?Rectal:  Deferred  ?  Msk:  Symmetrical. No boney deformities ?LAD: No inguinal or umbilical LAD ?Extremities:  No clubbing or edema. RA boney deformities of both hands.  ?Neurologic:  Alert and  oriented x4;  grossly nonfocal ?Skin:  Intact without significant lesions or rashes. ?Psych:  Alert and cooperative. Normal mood and affect. ? ? ? ? ?Madaleine Simmon L. Tarri Glenn, MD, MPH ?02/16/2022, 10:13 AM ? ? ? ?  ?

## 2022-02-16 NOTE — Progress Notes (Signed)
Vitals-DT  History reviewed. 

## 2022-02-16 NOTE — Op Note (Signed)
[facility] ?Patient Name: Diana LudwigStacey Bonneau ?Procedure Date: 02/16/2022 10:14 AM ?MRN: [id number] ?Endoscopist: Tressia DanasKimberly Gidget Quizhpi MD, MD ?Age: 56 ?Referring MD:  ?Date of Birth: [birth year] ?Gender: Female ?Account #: [id number] ?Procedure:                Upper GI endoscopy ?Indications:              Dysphagia, Abnormal cine-esophagram ?Medicines:                Monitored Anesthesia Care ?Procedure:                Pre-Anesthesia Assessment: ?                          - Prior to the procedure, a History and Physical  ?                          was performed, and patient medications and  ?                          allergies were reviewed. The patient's tolerance of  ?                          previous anesthesia was also reviewed. The risks  ?                          and benefits of the procedure and the sedation  ?                          options and risks were discussed with the patient.  ?                          All questions were answered, and informed consent  ?                          was obtained. Prior Anticoagulants: The patient has  ?                          taken no previous anticoagulant or antiplatelet  ?                          agents. ASA Grade Assessment: II - A patient with  ?                          mild systemic disease. After reviewing the risks  ?                          and benefits, the patient was deemed in  ?                          satisfactory condition to undergo the procedure. ?                          After obtaining informed consent, the endoscope was  ?  passed under direct vision. Throughout the  ?                          procedure, the patient's blood pressure, pulse, and  ?                          oxygen saturations were monitored continuously. The  ?                          GIF [id number] #[id number] was introduced through the  ?                          mouth, with the intention of advancing to the  ?                          duodenum. The scope  was advanced to the third part  ?                          of the duodenum before the procedure was aborted.  ?                          Medications were given. The upper GI endoscopy was  ?                          technically difficult and complex due to narrowing.  ?                          Successful completion of the procedure was aided by  ?                          changing the patient's position. The patient  ?                          tolerated the procedure well. ?Scope In: ?Scope Out: ?Findings:                 A web was found in the upper third of the  ?                          esophagus. I was unable to advance the gastroscope  ?                          beyond this area and could not fully assess it  ?                          enough to safely perform empiric balloon dilation. ?Complications:            No immediate complications. ?Estimated Blood Loss:     Estimated blood loss: none. ?Impression:               - Web in the upper third of the esophagus. Unable  ?                          to advance the gastroscope. ?                          -  No specimens collected. ?Recommendation:           - Patient has a contact number available for  ?                          emergencies. The signs and symptoms of potential  ?                          delayed complications were discussed with the  ?                          patient. Return to normal activities tomorrow.  ?                          Written discharge instructions were provided to the  ?                          patient. ?                          - Resume previous diet. ?                          - Continue present medications. ?                          - Refer to an ENT specialist at the next available  ?                          appointment for evaluation and dilation. ?Tressia Danas MD, MD ?02/16/2022 10:32:25 AM ?This report has been signed electronically. ?

## 2022-02-16 NOTE — Patient Instructions (Signed)
Resume previous diet.  Continue present medications.   ? ?Refer to an ENT specialist at the next available appointment for evaluation and dilation. ? ?YOU HAD AN ENDOSCOPIC PROCEDURE TODAY AT THE [facility]:   Refer to the procedure report that was given to you for any specific questions about what was found during the examination.  If the procedure report does not answer your questions, please call your gastroenterologist to clarify.  If you requested that your care partner not be given the details of your procedure findings, then the procedure report has been included in a sealed envelope for you to review at your convenience later. ? ?YOU SHOULD EXPECT: Some feelings of bloating in the abdomen. Passage of more gas than usual.  Walking can help get rid of the air that was put into your GI tract during the procedure and reduce the bloating. If you had a lower endoscopy (such as a colonoscopy or flexible sigmoidoscopy) you may notice spotting of blood in your stool or on the toilet paper. If you underwent a bowel prep for your procedure, you may not have a normal bowel movement for a few days. ? ?Please Note:  You might notice some irritation and congestion in your nose or some drainage.  This is from the oxygen used during your procedure.  There is no need for concern and it should clear up in a day or so. ? ?SYMPTOMS TO REPORT IMMEDIATELY: ? ?Following upper endoscopy (EGD) ? Vomiting of blood or coffee ground material ? New chest pain or pain under the shoulder blades ? Painful or persistently difficult swallowing ? New shortness of breath ? Fever of 100?F or higher ? Black, tarry-looking stools ? ?For urgent or emergent issues, a gastroenterologist can be reached at any hour by calling [phone]. ?Do not use MyChart messaging for urgent concerns.  ? ? ?DIET:  We do recommend a small meal at first, but then you may proceed to your regular diet.  Drink plenty of fluids but you should avoid  alcoholic beverages for 24 hours. ? ?ACTIVITY:  You should plan to take it easy for the rest of today and you should NOT DRIVE or use heavy machinery until tomorrow (because of the sedation medicines used during the test).   ? ?FOLLOW UP: ?Our staff will call the number listed on your records 48-72 hours following your procedure to check on you and address any questions or concerns that you may have regarding the information given to you following your procedure. If we do not reach you, we will leave a message.  We will attempt to reach you two times.  During this call, we will ask if you have developed any symptoms of COVID 19. If you develop any symptoms (ie: fever, flu-like symptoms, shortness of breath, cough etc.) before then, please call [phone].  If you test positive for Covid 19 in the 2 weeks post procedure, please call and report this information to [place].   ? ?If any biopsies were taken you will be contacted by phone or by letter within the next 1-3 weeks.  Please call us at [phone] if you have not heard about the biopsies in 3 weeks.  ? ? ?SIGNATURES/CONFIDENTIALITY: ?You and/or your care partner have signed paperwork which will be entered into your electronic medical record.  These signatures attest to the fact that that the information above on your After Visit Summary has been reviewed and is understood.  Full responsibility of the confidentiality  of this discharge information lies with you and/or your care-partner.  ?

## 2022-02-16 NOTE — Progress Notes (Signed)
A and O x3. Report to RN. Tolerated MAC anesthesia well.Teeth unchanged after procedure. 

## 2022-02-19 ENCOUNTER — Other Ambulatory Visit: Payer: Self-pay

## 2022-02-19 DIAGNOSIS — Q394 Esophageal web: Secondary | ICD-10-CM

## 2022-02-20 ENCOUNTER — Telehealth: Payer: Self-pay

## 2022-02-20 NOTE — Telephone Encounter (Signed)
?  Follow up Call- ? ?Call back number 02/16/2022  ?Permission to leave phone message Yes  ?  ? ?Patient questions: ? ?Do you have a fever, pain , or abdominal swelling? No. ?Pain Score  0 * ? ?Have you tolerated food without any problems? Yes.   ? ?Have you been able to return to your normal activities? Yes.   ? ?Do you have any questions about your discharge instructions: ?Diet   No. ?Medications  No. ?Follow up visit  No. ? ?Do you have questions or concerns about your Care? No. ? ?Actions: ?* If pain score is 4 or above: ?No action needed, pain <4. ? ?Have you developed a fever since your procedure? no ? ?2.   Have you had an respiratory symptoms (SOB or cough) since your procedure? no ? ?3.   Have you tested positive for COVID 19 since your procedure no ? ?4.   Have you had any family members/close contacts diagnosed with the COVID 19 since your procedure?  no ? ? ?If yes to any of these questions please route to Laverna Peace, RN and Karlton Lemon, RN ? ?

## 2022-02-27 ENCOUNTER — Other Ambulatory Visit: Payer: Self-pay

## 2022-02-27 ENCOUNTER — Ambulatory Visit
Admission: RE | Admit: 2022-02-27 | Discharge: 2022-02-27 | Disposition: A | Discharge status: Home / Self Care | Source: Ambulatory Visit | Attending: Internal Medicine | Admitting: Internal Medicine

## 2022-02-27 DIAGNOSIS — R42 Dizziness and giddiness: Secondary | ICD-10-CM

## 2022-06-26 IMAGING — CT CT CERVICAL SPINE W/O CM
2 series · 10 of 14 positions shown, 12 images · non-contrast
Comparison: MRI cervical spine 08/07/2020. Plain films cervical
spine 09/12/2020.

CLINICAL DATA: Cervical spondylosis and myelopathy. History of
rheumatoid arthritis.

EXAM:
CT CERVICAL SPINE WITHOUT CONTRAST
TECHNIQUE: Multidetector CT imaging of the cervical spine was performed without
intravenous contrast. Multiplanar CT image reconstructions were also
generated.

[Series 2: cspine soft (person_name) · axial · 0.21mm/px · z∈[-208,-96]mm · 5 of 86 slices shown]
[im 15/86  soft-tissue]
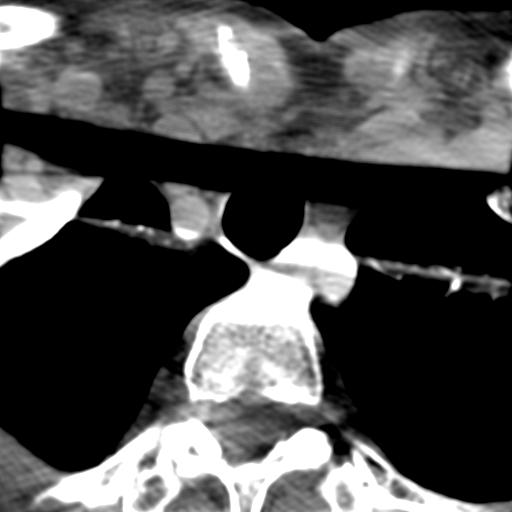
[im 29/86  soft-tissue]
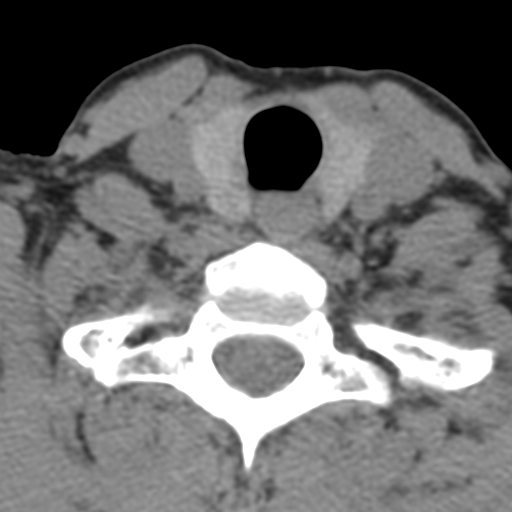
[im 43/86  soft-tissue]
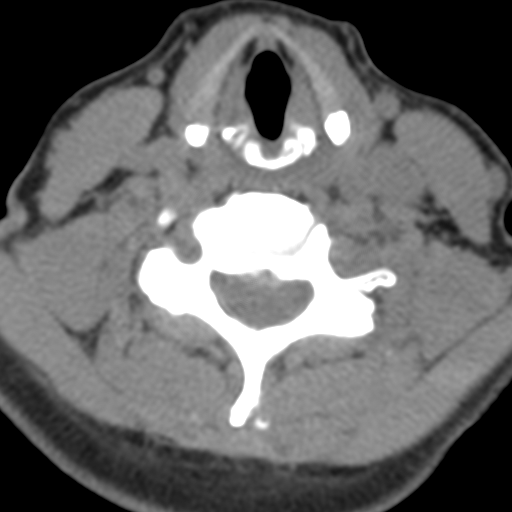
[im 57/86  soft-tissue]
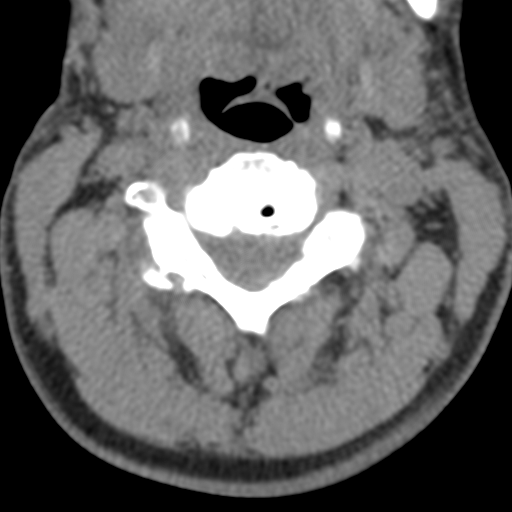
[im 71/86  soft-tissue]
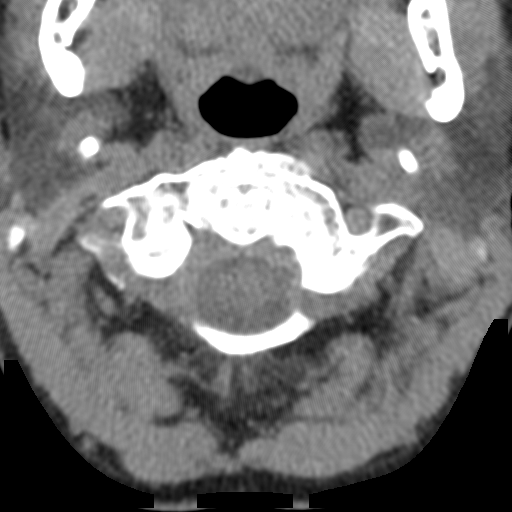

[Series 8: angled axial · axial · 0.23mm/px · z∈[-217,-107]mm · 5 of 86 slices shown, 7 images]
[im 15/86  soft-tissue]
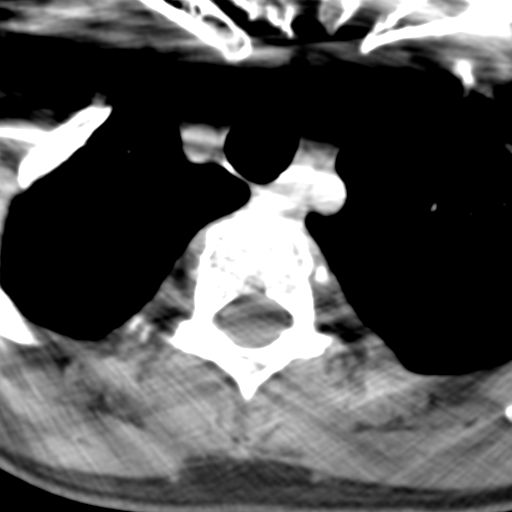
[im 15/86  bone]
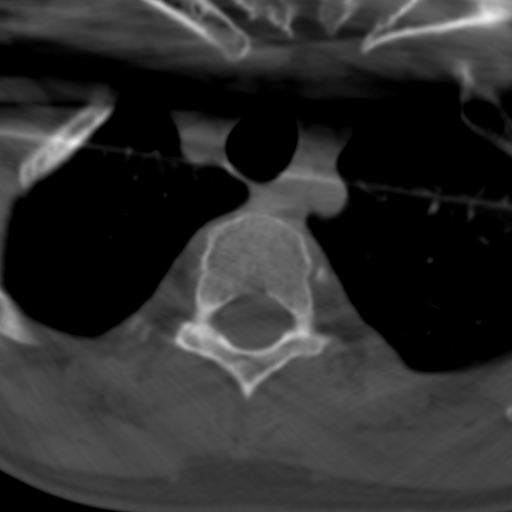
[im 29/86  bone]
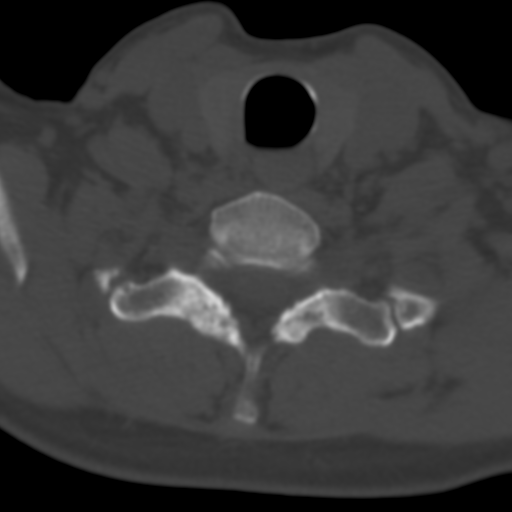
[im 43/86  bone]
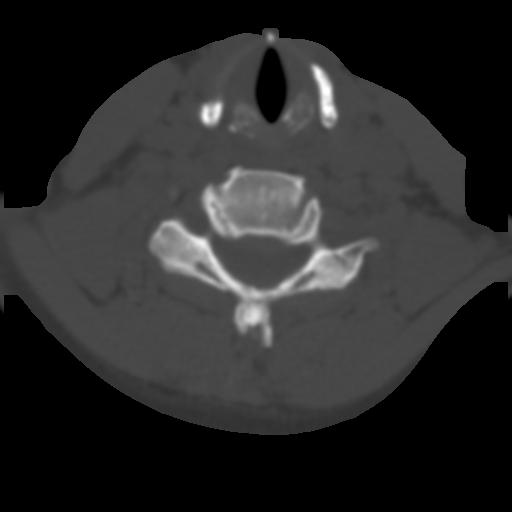
[im 57/86  bone]
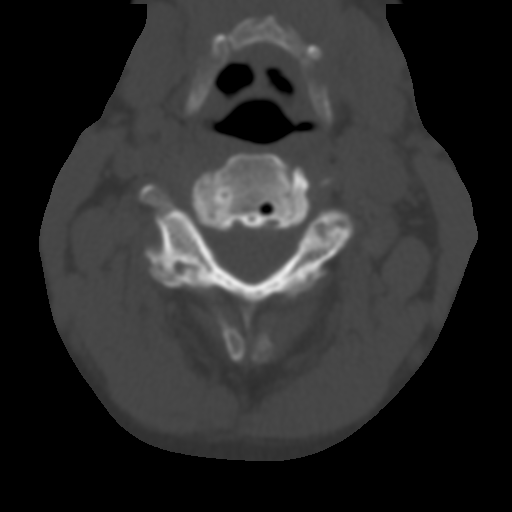
[im 71/86  soft-tissue]
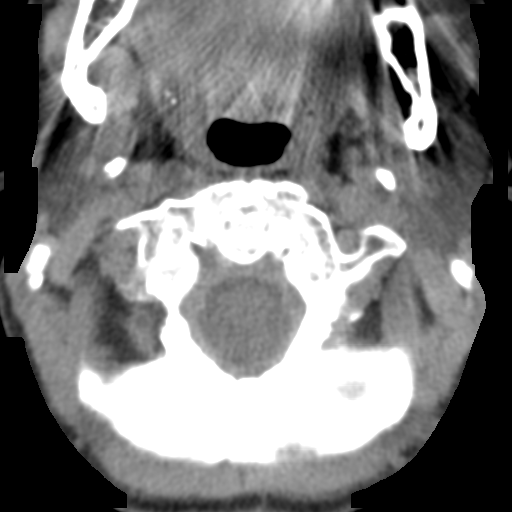
[im 71/86  bone]
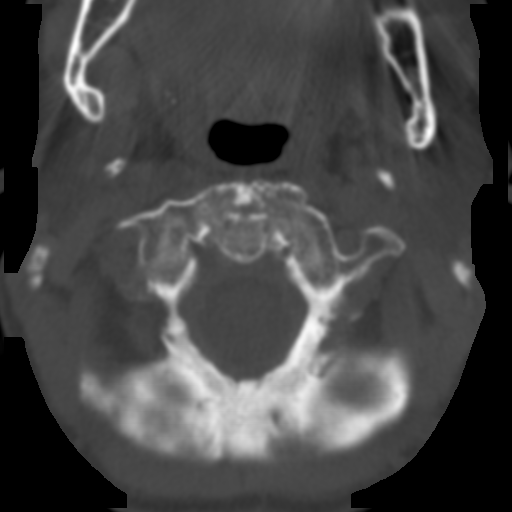

[10 of 14 positions shown; findings below may reference images not displayed]

FINDINGS: Alignment: Normal.

Skull base and vertebrae: No acute fracture. No primary bone lesion
or focal pathologic process. As seen on the prior MRI, there is
ankylosis of the atlantooccipital and C1-2 articulations.

Soft tissues and spinal canal: No prevertebral fluid or swelling. No
visible canal hematoma.

Disc levels: C2-3: Shallow disc bulge and advanced facet
degenerative disease, worse on the right. The central canal and
foramina appear open.

C3-4: Ligamentum flavum thickening, disc bulge with calcification of
the annulus eccentric to the left and bilateral uncovertebral
disease. Moderately severe bilateral facet arthropathy. Moderate
central canal stenosis. Moderately severe to severe foraminal
narrowing is worse on the right.

C4-5: Advanced bilateral facet degenerative change, disc bulge and
ligamentum flavum thickening. The ventral cord appears flattened.
Bilateral foraminal narrowing is worse on the right.

C5-6: Disc bulge and bilateral facet disease. Ligamentum flavum
thickening. Flattening of the ventral cord. Bilateral foraminal
narrowing is worse on the right.

C6-7: Shallow disc bulge. Right worse than left facet arthropathy.
Mild ligamentum flavum thickening. There is some flattening of the
ventral cord. Moderate right and mild left foraminal narrowing.

C7-T1: Negative.

Upper chest: Clear.

Other: None.
IMPRESSION: Ankylosis of the atlantooccipital and C1-2 articulations as seen on
prior MRI.

Multilevel cervical spondylosis as described above is better
demonstrated on the prior MRI.

## 2022-07-03 ENCOUNTER — Encounter: Payer: Self-pay | Admitting: Physical Medicine and Rehabilitation

## 2022-10-22 ENCOUNTER — Encounter: Admitting: Physical Medicine and Rehabilitation

## 2022-10-22 ENCOUNTER — Encounter: Payer: Self-pay | Admitting: Internal Medicine

## 2022-10-22 ENCOUNTER — Other Ambulatory Visit: Payer: Self-pay | Admitting: Internal Medicine

## 2022-10-22 DIAGNOSIS — R002 Palpitations: Secondary | ICD-10-CM

## 2022-10-23 ENCOUNTER — Ambulatory Visit: Payer: Self-pay

## 2022-10-23 DIAGNOSIS — R002 Palpitations: Secondary | ICD-10-CM

## 2022-10-23 NOTE — Progress Notes (Signed)
Monitor placed    ICD-10-CM   1. Palpitations  R00.2 LONG TERM MONITOR (3-14 DAYS)

## 2022-11-19 ENCOUNTER — Encounter: Attending: Physical Medicine and Rehabilitation | Admitting: Physical Medicine & Rehabilitation

## 2022-11-19 ENCOUNTER — Encounter: Payer: Self-pay | Admitting: Physical Medicine & Rehabilitation

## 2022-11-19 VITALS — BP 112/76 | HR 92 | Ht 61.0 in | Wt 105.0 lb

## 2022-11-19 DIAGNOSIS — M2550 Pain in unspecified joint: Secondary | ICD-10-CM | POA: Diagnosis present

## 2022-11-19 DIAGNOSIS — G894 Chronic pain syndrome: Secondary | ICD-10-CM | POA: Diagnosis present

## 2022-11-19 DIAGNOSIS — G8929 Other chronic pain: Secondary | ICD-10-CM | POA: Insufficient documentation

## 2022-11-19 DIAGNOSIS — Z79891 Long term (current) use of opiate analgesic: Secondary | ICD-10-CM | POA: Diagnosis present

## 2022-11-19 DIAGNOSIS — Z5181 Encounter for therapeutic drug level monitoring: Secondary | ICD-10-CM | POA: Diagnosis present

## 2022-11-19 DIAGNOSIS — M545 Low back pain, unspecified: Secondary | ICD-10-CM | POA: Diagnosis present

## 2022-11-19 DIAGNOSIS — M0520 Rheumatoid vasculitis with rheumatoid arthritis of unspecified site: Secondary | ICD-10-CM | POA: Diagnosis present

## 2022-11-19 NOTE — Progress Notes (Addendum)
Subjective:    Patient ID: Diana Peters, female    DOB: 1966/08/08, 56 y.o.   MRN: [id number]  HPI  Diana Peters is a 56 y.o. year old female  who  has a past medical history of Arthritis, Heart murmur, Rheumatoid arthritis (HCC), and Rheumatoid arthritis (HCC).   They are presenting to PM&R clinic as a new patient for pain management evaluation. They were referred for treatment of chronic pain.  Ms. enck has had pain due to RA which she has had for about 30 years, around 56.  She reports a lot of inflammation and severe disease due to her RA.  She has had multiple surgeries throughout her body due to this condition.  She reports her pain is everywhere.  She also has numbness and tingling in her fingertips that has a pins-and-needles type sensation.  She has recently been having issues with dizziness and vertigo.  She follows with Dr. Dierdre Forth Rheumatology- on actemra currently but she says she will be switched to a different medication soon.  Her PCP is Dr. Dorinda Hill.  She has difficulty swallowing big pills.  She is currently using hydrocodone 10 mg however she is breaking this pill in half and using this once or twice a day.  She reports she would like to wean off her opioid medications however pain becomes too severe when she tries to stop.  Prior procedures C3-C7 ACDF about 2 years ago Jaw reconstrtion- 1998 Shoulder replaced both- 2000 Both elbows -2005 and 2006 Both hips- 1997, 2012 Both knees- 1999 Both ankles- 2009 Left hand surgery- 2019 R thumb and wrist fused 2016 R great toe fused 2007   Medications tried: Tramadol Hydocodone Oxycodone Morphine Fentynyl patch Rheumatologist ordered- Tylenol and ibuprofen dont help Mobic helps with inflammation   Other treatments: Hips, knee-injection helped a little PT - after surgery helped to some degree      Pain Inventory Average Pain 4 Pain Right Now 4 My pain is constant and aching  In the  last 24 hours, has pain interfered with the following? General activity 8 Relation with others 0 Enjoyment of life 6 What TIME of day is your pain at its worst? varies Sleep (in general) Fair  Pain is worse with: walking, bending, sitting, standing, and some activites Pain improves with: rest, heat/ice, therapy/exercise, and medication Relief from Meds: 5  walk without assistance how many minutes can you walk? Several  ability to climb steps?  yes do you drive?  yes  not employed: date last employed .  weakness numbness tingling dizziness anxiety  Any changes since last visit?  no  Any changes since last visit?  no    Family History  Problem Relation Age of Onset   Breast cancer Maternal Aunt    Breast cancer Maternal Grandmother    Breast cancer Maternal Aunt    Healthy Mother    Healthy Father    Social History   Socioeconomic History   Marital status: Divorced    Spouse name: Not on file   Number of children: Not on file   Years of education: Not on file   Highest education level: Not on file  Occupational History   Not on file  Tobacco Use   Smoking status: Never   Smokeless tobacco: Never  Vaping Use   Vaping Use: Never used  Substance and Sexual Activity   Alcohol use: Yes    Comment: very rarely   Drug use: Never   Sexual  activity: Not Currently  Other Topics Concern   Not on file  Social History Narrative   ** Merged History Encounter **       Social Determinants of Health   Financial Resource Strain: Not on file  Food Insecurity: Not on file  Transportation Needs: Not on file  Physical Activity: Not on file  Stress: Not on file  Social Connections: Not on file   Past Surgical History:  Procedure Laterality Date   ANTERIOR CERVICAL DECOMPRESSION/DISCECTOMY FUSION 4 LEVELS N/A 10/26/2020   Procedure: ANTERIOR CERVICAL DECOMPRESSION/DISCECTOMY FUSION CERVICAL THREE-FOUR, CERVICAL FOUR-FIVE, CERVICAL FIVE-SIX, CERVICAL SIX-SEVEN;   Surgeon: Vallarie Mare, MD;  Location: [facility];  Service: Neurosurgery;  Laterality: N/A;   BREAST SURGERY Bilateral    augmentation   CESAREAN SECTION     x2   COLONOSCOPY     ESOPHAGOGASTRODUODENOSCOPY     finger joint replacementt Left    joint replacements of pointer and second finger   great toe fusion     great toe surgery Right    fusion right great toe   JOINT REPLACEMENT     MANDIBLE RECONSTRUCTION     REPLACEMENT TOTAL KNEE Bilateral    REVISION TOTAL HIP ARTHROPLASTY     bilat   REVISION TOTAL HIP ARTHROPLASTY Bilateral    TOTAL ANKLE ARTHROPLASTY     bilat   TOTAL ANKLE REPLACEMENT Bilateral    TOTAL ELBOW ARTHROPLASTY     bilat   TOTAL ELBOW REPLACEMENT Bilateral    TOTAL HIP ARTHROPLASTY     bilat   TOTAL HIP ARTHROPLASTY Bilateral    TOTAL KNEE ARTHROPLASTY     bilat   TOTAL SHOULDER ARTHROPLASTY     bilateral   TOTAL SHOULDER REPLACEMENT Bilateral    TOTAL WRIST ARTHROPLASTY Right    UPPER GASTROINTESTINAL ENDOSCOPY     WISDOM TOOTH EXTRACTION     WRIST FUSION     Past Medical History:  Diagnosis Date   Arthritis    RA   Heart murmur    Echo completed 5 years ago  - [facility] - report stated everything was normal   Rheumatoid arthritis (HCC)    Rheumatoid arthritis (HCC)    BP 112/76   Pulse 92   Ht 5\' 1"  (1.549 m)   Wt 105 lb (47.6 kg)   SpO2 98%   BMI 19.84 kg/m   Opioid Risk Score:   Fall Risk Score:  `1  Depression screen [facility] 2/9     11/19/2022    2:04 PM  Depression screen PHQ 2/9  Decreased Interest 1  Down, Depressed, Hopeless 1  PHQ - 2 Score 2  Altered sleeping 1  Tired, decreased energy 3  Change in appetite 0  Feeling bad or failure about yourself  1  Trouble concentrating 1  Moving slowly or fidgety/restless 1  Suicidal thoughts 0  PHQ-9 Score 9  Difficult doing work/chores Somewhat difficult      Review of Systems  Musculoskeletal:  Positive for back pain, gait problem, joint swelling,  myalgias and neck pain.  All other systems reviewed and are negative.     Objective:   Physical Exam   Gen: no distress, normal appearing HEENT: oral mucosa pink and moist, NCAT Cardio: Reg rate Chest: normal effort, normal rate of breathing Abd: soft, non-distended Ext: no edema Psych: pleasant, normal affect Skin: intact Neuro: alert and awake, follows commands, CN 2-12 grossly intact Musculoskeletal: No active joint inflammation/swelling noted Surgical healed  incision R wrist, L digits 1-2 Ulnar deviation fingers b/l  Decreased Rom b/l elbows right worse than left Decreased shoulder abduction ROM to about 90 degrees Tenderness to paraspinal muscles c spine to L spine Tenderness at b/l hips, elbows, hands, hips, knees, ankles   09/26/20 CT C spine  IMPRESSION: 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   Multilevel cervical spondylosis as described above is better demonstrated on the prior MRI.  02/27/22 MRI C spine  IMPRESSION: 1. 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 Associated mild basilar impression with the tip of the dens partially impinging upon the medulla. No associated severe stenosis or syrinx at this level. 2. Advanced multilevel cervical spondylosis with resultant moderate to severe spinal stenosis at C3-4 through C6-7. Subtle patchy cord signal changes at the levels of C3-4 and C4-5 suspicious for myelopathy. 3. Multifactorial degenerative changes with resultant moderate to severe bilateral C3 through C7 foraminal stenosis as above.       Assessment & Plan:   Chronic back and neck pain status post C3-C7 ACDF and polyarthralgia due to RA -UDS and Pain agreement today -Plan to continue hydrocodone, will decrease to 5/325 BID PRN-patient is only taking half of the 10/325 tabs once or twice a day -Consider Cymbalta- she would like to  avoid antidepressant class mediations at this time -Continue f/u with rheumatology for RA -She continues to have pins-and-needles pain in her hands, discussed trial of duloxetine however she would not like to take antidepressant medications, discussed trying gabapentin or Lyrica, she says she will think about this and consider at a later time -Continue Moloxicam  12/22 called pt, UDS appears consistent, she will call when she is low on the hydrocodone, she still has medication currently

## 2022-11-22 LAB — TOXASSURE SELECT,+ANTIDEPR,UR

## 2022-11-27 ENCOUNTER — Encounter: Payer: Self-pay | Admitting: Cardiology

## 2022-11-27 ENCOUNTER — Ambulatory Visit: Payer: Self-pay | Admitting: Cardiology

## 2022-11-27 VITALS — BP 118/77 | HR 89 | Resp 98 | Ht 59.0 in | Wt 104.6 lb

## 2022-11-27 DIAGNOSIS — R002 Palpitations: Secondary | ICD-10-CM

## 2022-11-27 DIAGNOSIS — E7800 Pure hypercholesterolemia, unspecified: Secondary | ICD-10-CM

## 2022-11-27 DIAGNOSIS — I491 Atrial premature depolarization: Secondary | ICD-10-CM

## 2022-11-27 DIAGNOSIS — R011 Cardiac murmur, unspecified: Secondary | ICD-10-CM

## 2022-11-27 DIAGNOSIS — M0609 Rheumatoid arthritis without rheumatoid factor, multiple sites: Secondary | ICD-10-CM

## 2022-11-27 MED ORDER — METOPROLOL TARTRATE 25 MG PO TABS: 12.5000 mg | ORAL_TABLET | Freq: Two times a day (BID) | ORAL | 0 refills | Status: DC

## 2022-11-27 NOTE — Progress Notes (Signed)
ID:  Diana Peters, DOB [birth year], MRN [id number]  PCP:  Michael Boston, MD  Cardiologist:  Rex Kras, DO, [facility] (established care 11/27/22)  REASON FOR CONSULT: Palpitations  REQUESTING PHYSICIAN:  Michael Boston, MD [address] [place],  [place] [address]  Chief Complaint  Patient presents with   Palpitations   New Patient (Initial Visit)    HPI  Diana Peters is a 56 y.o. Caucasian female who presents to the clinic for evaluation of palpitations at the request of Wile, Jesse Sans, MD. Her past medical history and cardiovascular risk factors include: Seronegative rheumatoid arthritis (diagnosed in 1993), depression/anxiety, hyperlipidemia (primary versus medication side effect from biological agents used for rheumatoid arthritis).   Patient was referred the office for evaluation of palpitations/atrial tachycardia.  Patient was having symptoms of palpitations and underwent a Zio patch as per PCPs recommendation.  She was noted to have underlying rhythm to be sinus with episodes of PAC/atrial tachycardia.  Since her blood pressures are soft she was referred to cardiology for further evaluation and management.  Patient has been experiencing flutter like sensation in the chest along with lightheaded and dizziness.  Fluttering has been ongoing for decades but recently more noticeable.  It occurs daily, lasting for few seconds, associated with lightheadedness, self-limited.  No improving or worsening factors.  No prior history of near-syncope or syncope.  The lightheadedness usually subsides once the flutter like sensation is resolved.  But she is dizzy all day long, per patient.   FUNCTIONAL STATUS: No structured exercise program or daily routine.   ALLERGIES: No Known Allergies  MEDICATION LIST PRIOR TO VISIT: Current Meds  Medication Sig   Calcium-Vitamin D-Vitamin K (VIACTIV PO) Take 1 tablet by mouth daily.   ferrous sulfate 325 (65 FE) MG tablet Take 325 mg by  mouth daily with breakfast.   golimumab (SIMPONI ARIA) 50 MG/4ML SOLN injection Inject into the vein.   HYDROcodone-acetaminophen (NORCO) 10-325 MG tablet Take 1 tablet by mouth every 6 (six) hours as needed for pain.   Meloxicam (MOBIC PO) Take by mouth.   metoprolol tartrate (LOPRESSOR) 25 MG tablet Take 0.5 tablets (12.5 mg total) by mouth 2 (two) times daily.   Multiple Vitamin (MULTIVITAMIN WITH MINERALS) TABS tablet Take 1 tablet by mouth daily.   solifenacin (VESICARE) 10 MG tablet Take 5 mg by mouth daily.     PAST MEDICAL HISTORY: Past Medical History:  Diagnosis Date   Arthritis    RA   Heart murmur    Echo completed 5 years ago  - [facility] - report stated everything was normal   Hyperlipidemia    Rheumatoid arthritis ([place])    Rheumatoid arthritis ([place])     PAST SURGICAL HISTORY: Past Surgical History:  Procedure Laterality Date   ANTERIOR CERVICAL DECOMPRESSION/DISCECTOMY FUSION 4 LEVELS N/A 10/26/2020   Procedure: ANTERIOR CERVICAL DECOMPRESSION/DISCECTOMY FUSION CERVICAL THREE-FOUR, CERVICAL FOUR-FIVE, CERVICAL FIVE-SIX, CERVICAL SIX-SEVEN;  Surgeon: Vallarie Mare, MD;  Location: [facility];  Service: Neurosurgery;  Laterality: N/A;   BREAST SURGERY Bilateral    augmentation   CESAREAN SECTION     x2   COLONOSCOPY     ESOPHAGOGASTRODUODENOSCOPY     finger joint replacementt Left    joint replacements of pointer and second finger   great toe fusion     great toe surgery Right    fusion right great toe   JOINT REPLACEMENT     MANDIBLE RECONSTRUCTION  REPLACEMENT TOTAL KNEE Bilateral    REVISION TOTAL HIP ARTHROPLASTY     bilat   REVISION TOTAL HIP ARTHROPLASTY Bilateral    TOTAL ANKLE ARTHROPLASTY     bilat   TOTAL ANKLE REPLACEMENT Bilateral    TOTAL ELBOW ARTHROPLASTY     bilat   TOTAL ELBOW REPLACEMENT Bilateral    TOTAL HIP ARTHROPLASTY     bilat   TOTAL HIP ARTHROPLASTY Bilateral    TOTAL KNEE ARTHROPLASTY     bilat   TOTAL  SHOULDER ARTHROPLASTY     bilateral   TOTAL SHOULDER REPLACEMENT Bilateral    TOTAL WRIST ARTHROPLASTY Right    UPPER GASTROINTESTINAL ENDOSCOPY     WISDOM TOOTH EXTRACTION     WRIST FUSION      FAMILY HISTORY: The patient family history includes Breast cancer in her maternal aunt, maternal aunt, and maternal grandmother; Healthy in her brother, brother, brother, father, mother, and sister.  SOCIAL HISTORY:  The patient  reports that she has never smoked. She has never used smokeless tobacco. She reports that she does not currently use alcohol. She reports that she does not use drugs.  REVIEW OF SYSTEMS: Review of Systems  Cardiovascular:  Positive for palpitations. Negative for chest pain, claudication, dyspnea on exertion, irregular heartbeat, leg swelling, near-syncope, orthopnea, paroxysmal nocturnal dyspnea and syncope.  Respiratory:  Negative for shortness of breath.   Hematologic/Lymphatic: Negative for bleeding problem.  Musculoskeletal:  Negative for muscle cramps and myalgias.  Neurological:  Positive for dizziness and light-headedness.    PHYSICAL EXAM:    11/27/2022   12:46 PM 11/19/2022    2:01 PM 02/16/2022   10:50 AM  Vitals with BMI  Height _0  _1    Weight 104 lbs 10 oz 105 lbs   BMI 35.59 74.16   Systolic 384 536 468  Diastolic 77 76 65  Pulse 89 92 80   Orthostatic VS for the past 72 hrs (Last 3 readings):  Orthostatic BP Patient Position BP Location Cuff Size Orthostatic Pulse  11/27/22 1304 (!) 118/99 Standing Left Arm Normal 99  11/27/22 1303 111/78 Sitting Left Arm Normal 86  11/27/22 1302 110/80 Supine Left Arm Normal 72    Physical Exam  Constitutional: No distress.  Age appropriate, hemodynamically stable.   Neck: No JVD present.  Right lateral surgical scar well-healed.  Cardiovascular: Regular rhythm, S1 normal, S2 normal, intact distal pulses and normal pulses. Tachycardia present. Exam reveals no gallop, no S3 and no S4.  Murmur  heard. Pulmonary/Chest: Effort normal and breath sounds normal. No stridor. She has no wheezes. She has no rales.  Abdominal: Soft. Bowel sounds are normal. She exhibits no distension. There is no abdominal tenderness.  Musculoskeletal:        General: Deformity (ulnar deviation, multiple joint replacement, decrease ROM) present. No edema.     Cervical back: Neck supple.  Neurological: She is alert and oriented to person, place, and time. She has intact cranial nerves (2-12).  Skin: Skin is warm and moist.   CARDIAC DATABASE: EKG: 11/27/2022: Sinus rhythm, 74 bpm, without underlying injury pattern.   Echocardiogram: No results found for this or any previous visit from the past 1095 days.    Stress Testing: No results found for this or any previous visit from the past 1095 days.   Heart Catheterization: None  Zio Patch Extended out patient EKG monitoring 7 days starting 10/23/2022: Predominant Rhythm :        Sinus min HR: 48. Max  HR 157 bpm Atrial arrhythmias:               3 brief atrial tachycardia episodes, 6 beats max.  Occasional PACs and rare atrial couplets and triplets noted. Atrial fibrillation:                  No atrial fibrillation. Ventricular arrhythmias:      Rare PVCs and ventricular couplets. PVC Burden <1% Heart Block:                        None Symptoms:                          Correlated with atrial tachycardia and PACs.  LABORATORY DATA:  External Labs: Collected: 10/09/2022 provided by referring physician. BUN 16, creatinine 0.8. eGFR 74.2 Sodium 139, potassium 5, chloride 104, bicarb 24. AST 23, ALT 20, alkaline phosphatase 62. Hemoglobin 13.9, hematocrit 37.5 Total cholesterol 240, triglycerides 96, HDL 49, LDL 172, non-HDL 191. TSH 1.76   IMPRESSION:    ICD-10-CM   1. Palpitations  R00.2 EKG 12-Lead    metoprolol tartrate (LOPRESSOR) 25 MG tablet    2. Murmur, cardiac  R01.1 PCV ECHOCARDIOGRAM COMPLETE    3. Premature atrial contraction   I49.1 metoprolol tartrate (LOPRESSOR) 25 MG tablet    4. Rheumatoid arthritis of multiple sites with negative rheumatoid factor (HCC)  M06.09 CT CARDIAC SCORING ([facility] LOCATIONS ONLY)    5. Pure hypercholesterolemia  E78.00 CT CARDIAC SCORING ([facility] LOCATIONS ONLY)       RECOMMENDATIONS: Diana Peters is a 56 y.o. Caucasian female whose past medical history and cardiac risk factors include: Seronegative rheumatoid arthritis (diagnosed in 1993), depression/anxiety, hyperlipidemia (primary versus medication side effect from biological agents used for rheumatoid arthritis).   Palpitations/PACs: Given her symptoms she had a cardiac monitor which noted the underlying rhythm to be sinus with a max heart rate of 157 bpm.  She had brief episodes of atrial tachycardia some of which were patient triggered events.   Due to her soft blood pressures and her lightheaded and dizziness as discussed above no no pharmacological therapy was initiated by primary team.  Given her symptoms and a tachycardia burden of approximately 13% she may benefit from AV nodal blocking agents to help improve her ventricular rate and in turn diastolic function.  This may lead to better blood pressure and less episodes of lightheaded and dizziness.  Patient is agreeable.  Will start Lopressor 12.5 mg p.o. twice daily.  Hold if SBP less than 100 mmHg.  Medication profile discussed.  She does have underlying anxiety/depression.  If she is able to tolerate Lopressor we will consider low-dose calcium channel blocker going forward.  Cardiac murmur: She had a systolic murmur on physical examination.  But the overall accuracy may be limited due to underlying tachycardia.  Given her risk factors shared decision was to proceed with an echocardiogram for further evaluation.  Hyperlipidemia: Patient states that is likely due to her rheumatoid arthritis medication.  She has for now discontinued that  biological and was started on  another medication couple days ago.  She plans to have her lipids rechecked with PCP.  I informed the patient that rheumatoid arthritis predisposes patients to CAD and her rheumatoid arthritis is quite extensive.  Shared decision is to proceed with coronary calcium score for further risk stratification.  Educated her on the importance of improving her modifiable cardiovascular risk  factors.  She is at elevated risk of falls due to decreased range of motion and multiple joint replacements in both upper and lower extremities.  Data Reviewed: I have independently reviewed external notes provided by the referring provider as part of this office visit.   I have independently reviewed results of labs, zio monitor,  as part of medical decision making. I have ordered the following tests:  Orders Placed This Encounter  Procedures   CT CARDIAC SCORING ([facility] LOCATIONS ONLY)    Standing Status:   Future    Standing Expiration Date:   11/28/2023    Order Specific Question:   Preferred imaging location?    Answer:   [facility]    Order Specific Question:   Release to patient    Answer:   Immediate    Order Specific Question:   Is patient pregnant?    Answer:   No    Comments:   Menopause   EKG 12-Lead   PCV ECHOCARDIOGRAM COMPLETE    Standing Status:   Future    Standing Expiration Date:   11/28/2023   I have made no medications changes at today's encounter as noted above.  FINAL MEDICATION LIST END OF ENCOUNTER: Meds ordered this encounter  Medications   metoprolol tartrate (LOPRESSOR) 25 MG tablet    Sig: Take 0.5 tablets (12.5 mg total) by mouth 2 (two) times daily.    Dispense:  30 tablet    Refill:  0    Medications Discontinued During This Encounter  Medication Reason   methylcellulose oral powder    pantoprazole (PROTONIX) 40 MG tablet    certolizumab pegol (CIMZIA) 2 X 200 MG KIT      Current Outpatient Medications:    Calcium-Vitamin D-Vitamin K (VIACTIV PO), Take 1 tablet by mouth  daily., Disp: , Rfl:    ferrous sulfate 325 (65 FE) MG tablet, Take 325 mg by mouth daily with breakfast., Disp: , Rfl:    golimumab (SIMPONI ARIA) 50 MG/4ML SOLN injection, Inject into the vein., Disp: , Rfl:    HYDROcodone-acetaminophen (NORCO) 10-325 MG tablet, Take 1 tablet by mouth every 6 (six) hours as needed for pain., Disp: , Rfl:    Meloxicam (MOBIC PO), Take by mouth., Disp: , Rfl:    metoprolol tartrate (LOPRESSOR) 25 MG tablet, Take 0.5 tablets (12.5 mg total) by mouth 2 (two) times daily., Disp: 30 tablet, Rfl: 0   Multiple Vitamin (MULTIVITAMIN WITH MINERALS) TABS tablet, Take 1 tablet by mouth daily., Disp: , Rfl:    solifenacin (VESICARE) 10 MG tablet, Take 5 mg by mouth daily., Disp: , Rfl:   Orders Placed This Encounter  Procedures   CT CARDIAC SCORING ([facility] LOCATIONS ONLY)   EKG 12-Lead   PCV ECHOCARDIOGRAM COMPLETE    There are no Patient Instructions on file for this visit.   --Continue cardiac medications as reconciled in final medication list. --Return in about 6 weeks (around 01/08/2023) for Follow up, Palpitations, Review test results. or sooner if needed. --Continue follow-up with your primary care physician regarding the management of your other chronic comorbid conditions.  Patient's questions and concerns were addressed to her satisfaction. She voices understanding of the instructions provided during this encounter.   This note was created using a voice recognition software as a result there may be grammatical errors inadvertently enclosed that do not reflect the nature of this encounter. Every attempt is made to correct such errors.  Rex Kras, [place], [facility]  Pager: [phone] Office: [phone]

## 2022-11-29 ENCOUNTER — Telehealth: Payer: Self-pay | Admitting: *Deleted

## 2022-11-29 NOTE — Telephone Encounter (Signed)
Urine drug screen for this encounter is consistent for prescribed medication 

## 2022-12-12 ENCOUNTER — Ambulatory Visit

## 2022-12-12 DIAGNOSIS — R011 Cardiac murmur, unspecified: Secondary | ICD-10-CM

## 2022-12-31 ENCOUNTER — Encounter: Admitting: Physical Medicine & Rehabilitation

## 2023-01-02 ENCOUNTER — Ambulatory Visit
Admission: RE | Admit: 2023-01-02 | Discharge: 2023-01-02 | Disposition: A | Discharge status: Home / Self Care | Source: Ambulatory Visit | Attending: Cardiology | Admitting: Cardiology

## 2023-01-02 DIAGNOSIS — E7800 Pure hypercholesterolemia, unspecified: Secondary | ICD-10-CM

## 2023-01-02 DIAGNOSIS — M0609 Rheumatoid arthritis without rheumatoid factor, multiple sites: Secondary | ICD-10-CM

## 2023-01-04 NOTE — Progress Notes (Signed)
Spoke with patient about results, and moved her back to Dr. Brennan Bailey schedule.

## 2023-01-07 ENCOUNTER — Encounter: Payer: Self-pay | Admitting: Cardiology

## 2023-01-07 ENCOUNTER — Ambulatory Visit: Admitting: Cardiology

## 2023-01-07 VITALS — BP 112/82 | HR 90 | Ht 59.0 in | Wt 107.8 lb

## 2023-01-07 DIAGNOSIS — I4719 Other supraventricular tachycardia: Secondary | ICD-10-CM

## 2023-01-07 DIAGNOSIS — I2510 Atherosclerotic heart disease of native coronary artery without angina pectoris: Secondary | ICD-10-CM

## 2023-01-07 DIAGNOSIS — E7800 Pure hypercholesterolemia, unspecified: Secondary | ICD-10-CM

## 2023-01-07 DIAGNOSIS — R931 Abnormal findings on diagnostic imaging of heart and coronary circulation: Secondary | ICD-10-CM

## 2023-01-07 DIAGNOSIS — I491 Atrial premature depolarization: Secondary | ICD-10-CM

## 2023-01-07 NOTE — Progress Notes (Signed)
ID:  Diana Peters, DOB 16-Jan-1966, MRN [id number]  PCP:  Melida Quitter, MD  Cardiologist:  Tessa Lerner, DO, [facility] (established care 11/27/22)  Date: 01/07/23 Last Office Visit: 11/27/2022  Chief Complaint  Patient presents with   Palpitations   Follow-up   Results    HPI  Diana Peters is a 57 y.o. Caucasian female whose past medical history and cardiovascular risk factors include: Mild CAC (99.2 AU, 94th percentile), Seronegative rheumatoid arthritis (diagnosed in 1993), depression/anxiety, hyperlipidemia (primary versus medication side effect from biological agents used for rheumatoid arthritis).   Given her prolonged history of palpitations she underwent a Zio patch with her PCP which noted episodes of PAC/atrial tachycardia.  At last office visit she was started on low-dose metoprolol but has not started the medication.  Clinically, she is relatively stable and her episodes of palpitations are for now well-controlled per patient.  In addition, patient's lipid panel was not well-controlled based on the records provided (LDL 172 mg/dL).  Patient states that is likely secondary to her Biologics for her rheumatoid arthritis.  She is currently having it addressed with her prescribing provider.  However, the shared decision was to proceed with coronary artery calcium score for further restratification. Patient was noted to have mild CAC placing him at the 94th percentile.  She denies anginal discomfort or heart failure symptoms.  Echocardiogram results reviewed.  No hospitalizations or urgent care visits for cardiovascular reasons since last office encounter.   FUNCTIONAL STATUS: No structured exercise program or daily routine.   ALLERGIES: No Known Allergies  MEDICATION LIST PRIOR TO VISIT: Current Meds  Medication Sig   Calcium-Vitamin D-Vitamin K (VIACTIV PO) Take 1 tablet by mouth daily.   ferrous sulfate 325 (65 FE) MG tablet Take 325 mg by mouth daily  with breakfast.   HYDROcodone-acetaminophen (NORCO) 10-325 MG tablet Take 1 tablet by mouth every 6 (six) hours as needed for pain.   Meloxicam (MOBIC PO) Take by mouth.   Multiple Vitamin (MULTIVITAMIN WITH MINERALS) TABS tablet Take 1 tablet by mouth daily.     PAST MEDICAL HISTORY: Past Medical History:  Diagnosis Date   Arthritis    RA   Heart murmur    Echo completed 5 years ago  - [facility] - report stated everything was normal   Hyperlipidemia    Rheumatoid arthritis (HCC)    Rheumatoid arthritis (HCC)     PAST SURGICAL HISTORY: Past Surgical History:  Procedure Laterality Date   ANTERIOR CERVICAL DECOMPRESSION/DISCECTOMY FUSION 4 LEVELS N/A 10/26/2020   Procedure: ANTERIOR CERVICAL DECOMPRESSION/DISCECTOMY FUSION CERVICAL THREE-FOUR, CERVICAL FOUR-FIVE, CERVICAL FIVE-SIX, CERVICAL SIX-SEVEN;  Surgeon: Bedelia Person, MD;  Location: [facility] OR;  Service: Neurosurgery;  Laterality: N/A;   BREAST SURGERY Bilateral    augmentation   CESAREAN SECTION     x2   COLONOSCOPY     ESOPHAGOGASTRODUODENOSCOPY     finger joint replacementt Left    joint replacements of pointer and second finger   great toe fusion     great toe surgery Right    fusion right great toe   JOINT REPLACEMENT     MANDIBLE RECONSTRUCTION     REPLACEMENT TOTAL KNEE Bilateral    REVISION TOTAL HIP ARTHROPLASTY     bilat   REVISION TOTAL HIP ARTHROPLASTY Bilateral    TOTAL ANKLE ARTHROPLASTY     bilat   TOTAL ANKLE REPLACEMENT Bilateral    TOTAL ELBOW ARTHROPLASTY     bilat  TOTAL ELBOW REPLACEMENT Bilateral    TOTAL HIP ARTHROPLASTY     bilat   TOTAL HIP ARTHROPLASTY Bilateral    TOTAL KNEE ARTHROPLASTY     bilat   TOTAL SHOULDER ARTHROPLASTY     bilateral   TOTAL SHOULDER REPLACEMENT Bilateral    TOTAL WRIST ARTHROPLASTY Right    UPPER GASTROINTESTINAL ENDOSCOPY     WISDOM TOOTH EXTRACTION     WRIST FUSION      FAMILY HISTORY: The patient family history includes  Breast cancer in her maternal aunt, maternal aunt, and maternal grandmother; Healthy in her brother, brother, brother, father, mother, and sister.  SOCIAL HISTORY:  The patient  reports that she has never smoked. She has never used smokeless tobacco. She reports that she does not currently use alcohol. She reports that she does not use drugs.  REVIEW OF SYSTEMS: Review of Systems  Cardiovascular:  Positive for palpitations (Chronic and stable). Negative for chest pain, claudication, dyspnea on exertion, irregular heartbeat, leg swelling, near-syncope, orthopnea, paroxysmal nocturnal dyspnea and syncope.  Respiratory:  Negative for shortness of breath.   Hematologic/Lymphatic: Negative for bleeding problem.  Musculoskeletal:  Negative for muscle cramps and myalgias.    PHYSICAL EXAM:    01/07/2023    3:43 PM 11/27/2022   12:46 PM 11/19/2022    2:01 PM  Vitals with BMI  Height 4\' 11"  4\' 11"  5\' 1"   Weight 107 lbs 13 oz 104 lbs 10 oz 105 lbs  BMI 21.76 41.74 08.14  Systolic 481 856 314  Diastolic 82 77 76  Pulse 90 89 92    Physical Exam  Constitutional: No distress.  Age appropriate, hemodynamically stable.   Neck: No JVD present.  Right lateral surgical scar well-healed.  Cardiovascular: Normal rate, regular rhythm, S1 normal, S2 normal, intact distal pulses and normal pulses. Exam reveals no gallop, no S3 and no S4.  No murmur heard. Pulmonary/Chest: Effort normal and breath sounds normal. No stridor. She has no wheezes. She has no rales.  Abdominal: Soft. Bowel sounds are normal. She exhibits no distension. There is no abdominal tenderness.  Musculoskeletal:        General: Deformity (ulnar deviation, multiple joint replacement, decrease ROM) present. No edema.     Cervical back: Neck supple.  Neurological: She is alert and oriented to person, place, and time. She has intact cranial nerves (2-12).  Skin: Skin is warm and moist.   CARDIAC DATABASE: EKG: 11/27/2022: Sinus  rhythm, 74 bpm, without underlying injury pattern.   Echocardiogram: 12/12/2022:  Normal LV systolic function with visual EF 60-65%. Left ventricle cavity is normal in size. Normal left ventricular wall thickness. Normal global wall motion.  Normal diastolic filling pattern, normal LAP.  Trace tricuspid regurgitation. No evidence of pulmonary hypertension.  No prior study for comparison   Stress Testing: No results found for this or any previous visit from the past 1095 days.   Heart Catheterization: None  CT Cardiac Scoring: 01/03/2023 Left main 0. LAD 0. LCx 0. RCA 99.2. Total CAC 99.2 AU, 94th percentile for patient's age, sex, and race. Noncardiac findings: Scattered aortic calcification   Zio Patch Extended out patient EKG monitoring 7 days starting 10/23/2022: Predominant Rhythm :        Sinus min HR: 48. Max HR 157 bpm Atrial arrhythmias:               3 brief atrial tachycardia episodes, 6 beats max.  Occasional PACs and rare atrial couplets and triplets noted. Atrial  fibrillation:                  No atrial fibrillation. Ventricular arrhythmias:      Rare PVCs and ventricular couplets. PVC Burden <1% Heart Block:                        None Symptoms:                          Correlated with atrial tachycardia and PACs.  LABORATORY DATA:  External Labs: Collected: 10/09/2022 provided by referring physician. BUN 16, creatinine 0.8. eGFR 74.2 Sodium 139, potassium 5, chloride 104, bicarb 24. AST 23, ALT 20, alkaline phosphatase 62. Hemoglobin 13.9, hematocrit 37.5 Total cholesterol 240, triglycerides 96, HDL 49, LDL 172, non-HDL 191. TSH 1.76   IMPRESSION:    ICD-10-CM   1. Coronary artery calcification seen on computed tomography  I25.10 PCV MYOCARDIAL PERFUSION WITH LEXISCAN    2. Agatston coronary artery calcium score less than 100  R93.1 PCV MYOCARDIAL PERFUSION WITH LEXISCAN    3. Pure hypercholesterolemia  E78.00 PCV MYOCARDIAL PERFUSION WITH LEXISCAN     4. Premature atrial contraction  I49.1     5. Atrial tachycardia  I47.19        RECOMMENDATIONS: Diana Peters is a 57 y.o. Caucasian female whose past medical history and cardiac risk factors include: Mild CAC (99.2 AU, 94th percentile), Seronegative rheumatoid arthritis (diagnosed in 1993), depression/anxiety, hyperlipidemia (primary versus medication side effect from biological agents used for rheumatoid arthritis).   Coronary artery calcification seen on computed tomography Agatston coronary artery calcium score less than 100 Total CAC 99.2 AU, 94th percentile.  We discussed starting aspirin 81 mg p.o. daily; however, she is already on Mobic and therefore the shared decision was to hold off to avoid risk of bleeding and PUD. Since her LDL levels are 172 mg/dL and mild CAC placing her at greater than 94th percentile when compared to her peers recommended statin therapy.  However patient remains reluctant.  She feels that the numbers should improve after stopping Biologics.  She will have repeat fasting lipids with PCP. Echocardiogram results reviewed. Given her subclinical CAD and other cardiovascular risk factors we discussed undergoing stress test to evaluate for reversible ischemia.  Given her extensive rheumatoid arthritis she is not able to exercise and is high risk for falls.  Would recommend pharmacological stress.   Pure hypercholesterolemia Last LDL 172 mg/dL Has stopped Biologics which may be contributing. Wants to hold off statin therapy despite mild CAC placing her at 94th percentile. She will discuss it further with PCP.  Premature atrial contraction Atrial tachycardia Clinically stable compared to the last office visit. Has not started on metoprolol tartrate as prescribed at the last visit. Will continue to monitor clinically and will utilize beta-blockers on as-needed basis.  FINAL MEDICATION LIST END OF ENCOUNTER: No orders of the defined types were  placed in this encounter.   There are no discontinued medications.    Current Outpatient Medications:    Calcium-Vitamin D-Vitamin K (VIACTIV PO), Take 1 tablet by mouth daily., Disp: , Rfl:    ferrous sulfate 325 (65 FE) MG tablet, Take 325 mg by mouth daily with breakfast., Disp: , Rfl:    HYDROcodone-acetaminophen (NORCO) 10-325 MG tablet, Take 1 tablet by mouth every 6 (six) hours as needed for pain., Disp: , Rfl:    Meloxicam (MOBIC PO), Take by mouth., Disp: , Rfl:  Multiple Vitamin (MULTIVITAMIN WITH MINERALS) TABS tablet, Take 1 tablet by mouth daily., Disp: , Rfl:    golimumab (SIMPONI ARIA) 50 MG/4ML SOLN injection, Inject into the vein. (Patient not taking: Reported on 01/07/2023), Disp: , Rfl:    metoprolol tartrate (LOPRESSOR) 25 MG tablet, Take 0.5 tablets (12.5 mg total) by mouth 2 (two) times daily. (Patient not taking: Reported on 01/07/2023), Disp: 30 tablet, Rfl: 0   solifenacin (VESICARE) 10 MG tablet, Take 5 mg by mouth daily. (Patient not taking: Reported on 01/07/2023), Disp: , Rfl:   Orders Placed This Encounter  Procedures   PCV MYOCARDIAL PERFUSION WITH LEXISCAN    There are no Patient Instructions on file for this visit.   --Continue cardiac medications as reconciled in final medication list. --Return in about 8 weeks (around 03/04/2023) for Follow up, Coronary artery calcification, Review test results. or sooner if needed. --Continue follow-up with your primary care physician regarding the management of your other chronic comorbid conditions.  Patient's questions and concerns were addressed to her satisfaction. She voices understanding of the instructions provided during this encounter.   This note was created using a voice recognition software as a result there may be grammatical errors inadvertently enclosed that do not reflect the nature of this encounter. Every attempt is made to correct such errors.  Tessa Lerner, [place], [facility]  Pager: [phone] Office:  [phone]

## 2023-01-08 ENCOUNTER — Ambulatory Visit: Admitting: Internal Medicine

## 2023-01-22 ENCOUNTER — Other Ambulatory Visit

## 2023-02-04 ENCOUNTER — Other Ambulatory Visit

## 2023-02-18 ENCOUNTER — Ambulatory Visit

## 2023-02-18 DIAGNOSIS — E7800 Pure hypercholesterolemia, unspecified: Secondary | ICD-10-CM

## 2023-02-18 DIAGNOSIS — R931 Abnormal findings on diagnostic imaging of heart and coronary circulation: Secondary | ICD-10-CM

## 2023-02-18 DIAGNOSIS — I2510 Atherosclerotic heart disease of native coronary artery without angina pectoris: Secondary | ICD-10-CM

## 2023-02-21 ENCOUNTER — Encounter: Payer: Self-pay | Admitting: Cardiology

## 2023-02-21 NOTE — Telephone Encounter (Signed)
From patient.

## 2023-02-28 ENCOUNTER — Encounter: Admitting: Physical Medicine & Rehabilitation

## 2023-03-04 ENCOUNTER — Ambulatory Visit: Admitting: Cardiology

## 2023-03-18 ENCOUNTER — Ambulatory Visit: Admitting: Cardiology

## 2023-03-25 ENCOUNTER — Encounter: Payer: Self-pay | Admitting: Physical Medicine & Rehabilitation

## 2023-03-25 ENCOUNTER — Encounter: Attending: Physical Medicine & Rehabilitation | Admitting: Physical Medicine & Rehabilitation

## 2023-03-25 ENCOUNTER — Encounter: Payer: Self-pay | Admitting: Cardiology

## 2023-03-25 ENCOUNTER — Ambulatory Visit: Admitting: Cardiology

## 2023-03-25 VITALS — BP 111/78 | HR 88 | Ht 59.0 in | Wt 104.6 lb

## 2023-03-25 VITALS — BP 102/72 | HR 102 | Resp 16 | Ht 59.0 in | Wt 100.0 lb

## 2023-03-25 DIAGNOSIS — M2550 Pain in unspecified joint: Secondary | ICD-10-CM | POA: Diagnosis present

## 2023-03-25 DIAGNOSIS — I2510 Atherosclerotic heart disease of native coronary artery without angina pectoris: Secondary | ICD-10-CM

## 2023-03-25 DIAGNOSIS — M545 Low back pain, unspecified: Secondary | ICD-10-CM

## 2023-03-25 DIAGNOSIS — M0520 Rheumatoid vasculitis with rheumatoid arthritis of unspecified site: Secondary | ICD-10-CM | POA: Diagnosis present

## 2023-03-25 DIAGNOSIS — I491 Atrial premature depolarization: Secondary | ICD-10-CM

## 2023-03-25 DIAGNOSIS — G894 Chronic pain syndrome: Secondary | ICD-10-CM | POA: Diagnosis present

## 2023-03-25 DIAGNOSIS — Z79891 Long term (current) use of opiate analgesic: Secondary | ICD-10-CM

## 2023-03-25 DIAGNOSIS — R931 Abnormal findings on diagnostic imaging of heart and coronary circulation: Secondary | ICD-10-CM

## 2023-03-25 DIAGNOSIS — G8929 Other chronic pain: Secondary | ICD-10-CM | POA: Insufficient documentation

## 2023-03-25 DIAGNOSIS — I4719 Other supraventricular tachycardia: Secondary | ICD-10-CM

## 2023-03-25 DIAGNOSIS — E7800 Pure hypercholesterolemia, unspecified: Secondary | ICD-10-CM

## 2023-03-25 NOTE — Progress Notes (Signed)
Subjective:    Patient ID: Diana Peters, female    DOB: 10/08/1966, 57 y.o.   MRN: [id number]  HPI  HPI   Diana Peters is a 57 y.o. year old female  who  has a past medical history of Arthritis, Heart murmur, Rheumatoid arthritis (HCC), and Rheumatoid arthritis (HCC).   They are presenting to PM&R clinic as a new patient for pain management evaluation. They were referred for treatment of chronic pain.  Ms. krouse has had pain due to RA which she has had for about 30 years, around 57.  She reports a lot of inflammation and severe disease due to her RA.  She has had multiple surgeries throughout her body due to this condition.  She reports her pain is everywhere.  She also has numbness and tingling in her fingertips that has a pins-and-needles type sensation.  She has recently been having issues with dizziness and vertigo.  She follows with Dr. Dierdre Forth Rheumatology- on actemra currently but she says she will be switched to a different medication soon.  Her PCP is Dr. Dorinda Hill.  She has difficulty swallowing big pills.  She is currently using hydrocodone 10 mg however she is breaking this pill in half and using this once or twice a day.  She reports she would like to wean off her opioid medications however pain becomes too severe when she tries to stop.   Prior procedures C3-C7 ACDF about 2 years ago Jaw reconstrtion- 1998 Shoulder replaced both- 2000 Both elbows -2005 and 2006 Both hips- 1997, 2012 Both knees- 1999 Both ankles- 2009 Left hand surgery- 2019 R thumb and wrist fused 2016 R great toe fused 2007     Medications tried: Tramadol Hydocodone Oxycodone Morphine Fentynyl patch Rheumatologist ordered- Tylenol and ibuprofen dont help Mobic helps with inflammation     Other treatments: Hips, knee-injection helped a little PT - after surgery helped to some degree  Interval history 03/25/2023 Diana Peters is here for follow-up regarding her body wide  pain related to rheumatoid arthritis. Her neck, back, feet continue to be some of the greater sources of her pain.  She feels like her pain is worse recently because rheumatology try her on a new medication.  She did not feel that the new medication was working.  She is now back on her prior medication but at a lower dose.  She continues to use hydrocodone.  She breaks her 10 mg tabs in half and takes half a tablet in the morning and half a tablet at night most days.  She will occasionally take an additional half tablet if the pain is very severe.  She has not yet needed a refill of this medication because she tries to use it so sparingly.   .Pain Inventory Average Pain 5 Pain Right Now 5 My pain is sharp, dull, stabbing, and aching  In the last 24 hours, has pain interfered with the following? General activity 6 Relation with others 6 Enjoyment of life 6 What TIME of day is your pain at its worst? morning , daytime, evening, and night Sleep (in general) Poor  Pain is worse with: walking, bending, inactivity, and some activites Pain improves with: rest, heat/ice, and medication Relief from Meds: 6  Family History  Problem Relation Age of Onset   Healthy Mother    Healthy Father    Healthy Sister    Healthy Brother    Healthy Brother    Healthy Brother    Breast  cancer Maternal Aunt    Breast cancer Maternal Aunt    Breast cancer Maternal Grandmother    Social History   Socioeconomic History   Marital status: Divorced    Spouse name: Not on file   Number of children: 2   Years of education: Not on file   Highest education level: Not on file  Occupational History   Not on file  Tobacco Use   Smoking status: Never   Smokeless tobacco: Never  Vaping Use   Vaping Use: Never used  Substance and Sexual Activity   Alcohol use: Not Currently    Comment: very rarely   Drug use: Never   Sexual activity: Not Currently  Other Topics Concern   Not on file  Social History  Narrative   ** Merged History Encounter **       Social Determinants of Health   Financial Resource Strain: Not on file  Food Insecurity: Not on file  Transportation Needs: Not on file  Physical Activity: Not on file  Stress: Not on file  Social Connections: Not on file   Past Surgical History:  Procedure Laterality Date   ANTERIOR CERVICAL DECOMPRESSION/DISCECTOMY FUSION 4 LEVELS N/A 10/26/2020   Procedure: ANTERIOR CERVICAL DECOMPRESSION/DISCECTOMY FUSION CERVICAL THREE-FOUR, CERVICAL FOUR-FIVE, CERVICAL FIVE-SIX, CERVICAL SIX-SEVEN;  Surgeon: Bedelia Person, MD;  Location: [facility] OR;  Service: Neurosurgery;  Laterality: N/A;   BREAST SURGERY Bilateral    augmentation   CESAREAN SECTION     x2   COLONOSCOPY     ESOPHAGOGASTRODUODENOSCOPY     finger joint replacementt Left    joint replacements of pointer and second finger   great toe fusion     great toe surgery Right    fusion right great toe   JOINT REPLACEMENT     MANDIBLE RECONSTRUCTION     REPLACEMENT TOTAL KNEE Bilateral    REVISION TOTAL HIP ARTHROPLASTY     bilat   REVISION TOTAL HIP ARTHROPLASTY Bilateral    TOTAL ANKLE ARTHROPLASTY     bilat   TOTAL ANKLE REPLACEMENT Bilateral    TOTAL ELBOW ARTHROPLASTY     bilat   TOTAL ELBOW REPLACEMENT Bilateral    TOTAL HIP ARTHROPLASTY     bilat   TOTAL HIP ARTHROPLASTY Bilateral    TOTAL KNEE ARTHROPLASTY     bilat   TOTAL SHOULDER ARTHROPLASTY     bilateral   TOTAL SHOULDER REPLACEMENT Bilateral    TOTAL WRIST ARTHROPLASTY Right    UPPER GASTROINTESTINAL ENDOSCOPY     WISDOM TOOTH EXTRACTION     WRIST FUSION     Past Surgical History:  Procedure Laterality Date   ANTERIOR CERVICAL DECOMPRESSION/DISCECTOMY FUSION 4 LEVELS N/A 10/26/2020   Procedure: ANTERIOR CERVICAL DECOMPRESSION/DISCECTOMY FUSION CERVICAL THREE-FOUR, CERVICAL FOUR-FIVE, CERVICAL FIVE-SIX, CERVICAL SIX-SEVEN;  Surgeon: Bedelia Person, MD;  Location: [facility] OR;  Service: Neurosurgery;   Laterality: N/A;   BREAST SURGERY Bilateral    augmentation   CESAREAN SECTION     x2   COLONOSCOPY     ESOPHAGOGASTRODUODENOSCOPY     finger joint replacementt Left    joint replacements of pointer and second finger   great toe fusion     great toe surgery Right    fusion right great toe   JOINT REPLACEMENT     MANDIBLE RECONSTRUCTION     REPLACEMENT TOTAL KNEE Bilateral    REVISION TOTAL HIP ARTHROPLASTY     bilat   REVISION TOTAL HIP ARTHROPLASTY Bilateral    TOTAL  ANKLE ARTHROPLASTY     bilat   TOTAL ANKLE REPLACEMENT Bilateral    TOTAL ELBOW ARTHROPLASTY     bilat   TOTAL ELBOW REPLACEMENT Bilateral    TOTAL HIP ARTHROPLASTY     bilat   TOTAL HIP ARTHROPLASTY Bilateral    TOTAL KNEE ARTHROPLASTY     bilat   TOTAL SHOULDER ARTHROPLASTY     bilateral   TOTAL SHOULDER REPLACEMENT Bilateral    TOTAL WRIST ARTHROPLASTY Right    UPPER GASTROINTESTINAL ENDOSCOPY     WISDOM TOOTH EXTRACTION     WRIST FUSION     Past Medical History:  Diagnosis Date   Arthritis    RA   Heart murmur    Echo completed 5 years ago  - [facility] - report stated everything was normal   Hyperlipidemia    Rheumatoid arthritis    Rheumatoid arthritis    BP 111/78   Pulse 88   Ht  (1.499 m)   Wt 104 lb 9.6 oz (47.4 kg)   SpO2 96%   BMI 21.13 kg/m   Opioid Risk Score:   Fall Risk Score:  `1  Depression screen [facility] 2/9     03/25/2023    3:31 PM 11/19/2022    2:04 PM  Depression screen PHQ 2/9  Decreased Interest 2 1  Down, Depressed, Hopeless 2 1  PHQ - 2 Score 4 2  Altered sleeping  1  Tired, decreased energy  3  Change in appetite  0  Feeling bad or failure about yourself   1  Trouble concentrating  1  Moving slowly or fidgety/restless  1  Suicidal thoughts  0  PHQ-9 Score  9  Difficult doing work/chores  Somewhat difficult     Review of Systems  Constitutional: Negative.   HENT: Negative.    Eyes: Negative.   Respiratory: Negative.     Cardiovascular: Negative.   Gastrointestinal: Negative.   Endocrine: Negative.   Genitourinary: Negative.   Musculoskeletal: Negative.   Skin: Negative.   Allergic/Immunologic: Negative.   Neurological: Negative.   Hematological: Negative.   Psychiatric/Behavioral: Negative.    All other systems reviewed and are negative.      Objective:   Physical Exam    Gen: no distress, normal appearing HEENT: oral mucosa pink and moist, NCAT Cardio: Reg rate Chest: normal effort, normal rate of breathing Abd: soft, non-distended Ext: no edema Psych: pleasant, normal affect Skin: intact Neuro: alert and awake, follows commands, CN 2-12 grossly intact Musculoskeletal: No active joint inflammation/swelling noted Surgical healed incision R wrist, L digits 1-2 Ulnar deviation fingers b/l  Decreased Rom b/l elbows right worse than left-she has difficulty reaching her back of her neck Tenderness of the cervical, thoracic, and lumbar paraspinal muscles. 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   Multilevel cervical spondylosis as described above is better demonstrated on the prior MRI.   02/27/22 MRI C spine  IMPRESSION: 1. 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 Associated mild basilar impression with the tip of the dens partially impinging upon the medulla. No associated severe stenosis or syrinx at this level. 2. Advanced multilevel cervical spondylosis with resultant moderate to severe spinal stenosis at C3-4 through C6-7. Subtle patchy cord signal changes at the levels of C3-4 and C4-5 suspicious  for myelopathy. 3. Multifactorial degenerative changes with resultant moderate to severe bilateral C3 through C7 foraminal stenosis as above.       Assessment & Plan:    Chronic back and neck pain status post C3-C7 ACDF and polyarthralgia due to RA -UDS and Pain agreement today -Will plan to start hydrocodone 5 1-2 tabs BID as needed, advised her to call when she has about a week left of medication -Consider Cymbalta- she would like to avoid antidepressant class mediations at this time -Continue f/u with rheumatology for RA -She continues to have pins-and-needles pain in her hands, discussed trial of duloxetine however she would not like to take antidepressant medications, discussed trying gabapentin or Lyrica, she is not interested at this time -Continue Moloxicam -Discussed TENS unit, she says she has one at home and will try again

## 2023-03-25 NOTE — Progress Notes (Signed)
ID:  Diana Peters, DOB August 14, 1966, MRN [id number]  PCP:  Melida Quitter, MD  Cardiologist:  Tessa Lerner, DO, [facility] (established care 11/27/22)  Date: 03/25/23 Last Office Visit: 01/07/2023  Chief Complaint  Patient presents with   Coronary Artery Calcification   Follow-up    8 weeks    HPI  Diana Peters is a 57 y.o. Caucasian female whose past medical history and cardiovascular risk factors include: Mild CAC (99.2 AU, 94th percentile), Seronegative rheumatoid arthritis (diagnosed in 1993), depression/anxiety, hyperlipidemia (primary versus medication side effect from biological agents used for rheumatoid arthritis).   Referred to the practice for evaluation of palpitations.  She underwent a Zio patch which noted episodes of PACs/atrial tachycardia.  Her symptoms resolved and did not require pharmacological therapy.  Patient's lipids were not well-controlled as of October 2023 at which time her LDL was noted to be 172 mg/dL.  Patient is attributing this to being on her Biologics for rheumatoid arthritis.  At that time she was on Actemra.  She discontinued Actemra for approximately 3 months and repeat lipids in February 2024 noted improvement in lipids with LDL down to 112 mg/dL.  Unfortunately, she has not tolerated any other Biologics for rheumatoid arthritis and has restarted Actemra approximately 6 weeks ago.  She has repeat labs with her rheumatologist in mid May to reevaluate her lipids and if warranted will be started on statin therapy.  She is on a lower dose of Actemra when compared to prior and she is also started eating diet.  Given her pure hypercholesterolemia she did undergo coronary calcium score which noted mild CAC placing her at the 94th percentile at the last office visit she was recommended to undergo stress test to evaluate for reversible ischemia.  Results reviewed with her again today and noted below for further reference.  Clinically denies anginal  chest pain or heart failure symptoms.  FUNCTIONAL STATUS: No structured exercise program or daily routine.   ALLERGIES: No Known Allergies  MEDICATION LIST PRIOR TO VISIT: Current Meds  Medication Sig   Calcium-Vitamin D-Vitamin K (VIACTIV PO) Take 1 tablet by mouth daily.   ferrous sulfate 325 (65 FE) MG tablet Take 325 mg by mouth daily with breakfast.   HYDROcodone-acetaminophen (NORCO) 10-325 MG tablet Take 1 tablet by mouth every 6 (six) hours as needed for pain.   Meloxicam (MOBIC PO) Take by mouth.   Multiple Vitamin (MULTIVITAMIN WITH MINERALS) TABS tablet Take 1 tablet by mouth daily.   Tocilizumab (ACTEMRA [place]) Inject into the skin.     PAST MEDICAL HISTORY: Past Medical History:  Diagnosis Date   Arthritis    RA   Heart murmur    Echo completed 5 years ago  - [facility] - report stated everything was normal   Hyperlipidemia    Rheumatoid arthritis    Rheumatoid arthritis     PAST SURGICAL HISTORY: Past Surgical History:  Procedure Laterality Date   ANTERIOR CERVICAL DECOMPRESSION/DISCECTOMY FUSION 4 LEVELS N/A 10/26/2020   Procedure: ANTERIOR CERVICAL DECOMPRESSION/DISCECTOMY FUSION CERVICAL THREE-FOUR, CERVICAL FOUR-FIVE, CERVICAL FIVE-SIX, CERVICAL SIX-SEVEN;  Surgeon: Bedelia Person, MD;  Location: [facility] OR;  Service: Neurosurgery;  Laterality: N/A;   BREAST SURGERY Bilateral    augmentation   CESAREAN SECTION     x2   COLONOSCOPY     ESOPHAGOGASTRODUODENOSCOPY     finger joint replacementt Left    joint replacements of pointer and second finger   great toe fusion  great toe surgery Right    fusion right great toe   JOINT REPLACEMENT     MANDIBLE RECONSTRUCTION     REPLACEMENT TOTAL KNEE Bilateral    REVISION TOTAL HIP ARTHROPLASTY     bilat   REVISION TOTAL HIP ARTHROPLASTY Bilateral    TOTAL ANKLE ARTHROPLASTY     bilat   TOTAL ANKLE REPLACEMENT Bilateral    TOTAL ELBOW ARTHROPLASTY     bilat   TOTAL ELBOW REPLACEMENT  Bilateral    TOTAL HIP ARTHROPLASTY     bilat   TOTAL HIP ARTHROPLASTY Bilateral    TOTAL KNEE ARTHROPLASTY     bilat   TOTAL SHOULDER ARTHROPLASTY     bilateral   TOTAL SHOULDER REPLACEMENT Bilateral    TOTAL WRIST ARTHROPLASTY Right    UPPER GASTROINTESTINAL ENDOSCOPY     WISDOM TOOTH EXTRACTION     WRIST FUSION      FAMILY HISTORY: The patient family history includes Breast cancer in her maternal aunt, maternal aunt, and maternal grandmother; Healthy in her brother, brother, brother, father, mother, and sister.  SOCIAL HISTORY:  The patient  reports that she has never smoked. She has never used smokeless tobacco. She reports that she does not currently use alcohol. She reports that she does not use drugs.  REVIEW OF SYSTEMS: Review of Systems  Cardiovascular:  Positive for palpitations (Stable). Negative for chest pain, claudication, dyspnea on exertion, irregular heartbeat, leg swelling, near-syncope, orthopnea, paroxysmal nocturnal dyspnea and syncope.  Respiratory:  Negative for shortness of breath.   Hematologic/Lymphatic: Negative for bleeding problem.  Musculoskeletal:  Positive for arthritis and joint pain. Negative for muscle cramps and myalgias.    PHYSICAL EXAM:    03/25/2023   11:52 AM 01/07/2023    3:43 PM 11/27/2022   12:46 PM  Vitals with BMI  Height 4\' 11"  4\' 11"  4\' 11"   Weight 100 lbs 107 lbs 13 oz 104 lbs 10 oz  BMI 20.19 21.76 21.12  Systolic 102 112 784  Diastolic 72 82 77  Pulse 102 90 89    Physical Exam  Constitutional: No distress.  Age appropriate, hemodynamically stable.   Neck: No JVD present.  Right lateral surgical scar well-healed.  Cardiovascular: Normal rate, regular rhythm, S1 normal, S2 normal, intact distal pulses and normal pulses. Exam reveals no gallop, no S3 and no S4.  No murmur heard. Pulmonary/Chest: Effort normal and breath sounds normal. No stridor. She has no wheezes. She has no rales.  Abdominal: Soft. Bowel sounds are  normal. She exhibits no distension. There is no abdominal tenderness.  Musculoskeletal:        General: Deformity (ulnar deviation, multiple joint replacement, decrease ROM) present. No edema.     Cervical back: Neck supple.  Neurological: She is alert and oriented to person, place, and time. She has intact cranial nerves (2-12).  Skin: Skin is warm and moist.   CARDIAC DATABASE: EKG: 11/27/2022: Sinus rhythm, 74 bpm, without underlying injury pattern.   Echocardiogram: 12/12/2022:  Normal LV systolic function with visual EF 60-65%. Left ventricle cavity is normal in size. Normal left ventricular wall thickness. Normal global wall motion.  Normal diastolic filling pattern, normal LAP.  Trace tricuspid regurgitation. No evidence of pulmonary hypertension.  No prior study for comparison   Stress Testing: Lexiscan Sestamibi stress test 02/18/2023: 1 Day Rest/Stress Protocol. Stress EKG is non-diagnostic, as this is pharmacological stress test using Lexiscan. No apparent evidence of reversible myocardial ischemia.  Small size, fixed perfusion defect suggestive  of prior infarct in the distal LAD distribution. Left ventricular size preserved. Left ventricular wall thickness is overall preserved with mild hypokinesis of apical septal segment. Calculated LVEF 79% (likely overestimated due to small end-systolic volume-correlate with echocardiography).  No prior studies of for comparison. Low risk study.   Heart Catheterization: None  CT Cardiac Scoring: 01/03/2023 Left main 0. LAD 0. LCx 0. RCA 99.2. Total CAC 99.2 AU, 94th percentile for patient's age, sex, and race. Noncardiac findings: Scattered aortic calcification   Zio Patch Extended out patient EKG monitoring 7 days starting 10/23/2022: Predominant Rhythm :        Sinus min HR: 48. Max HR 157 bpm Atrial arrhythmias:               3 brief atrial tachycardia episodes, 6 beats max.  Occasional PACs and rare atrial couplets and  triplets noted. Atrial fibrillation:                  No atrial fibrillation. Ventricular arrhythmias:      Rare PVCs and ventricular couplets. PVC Burden <1% Heart Block:                        None Symptoms:                          Correlated with atrial tachycardia and PACs.  LABORATORY DATA:  External Labs: Collected: 10/09/2022 provided by referring physician. BUN 16, creatinine 0.8. eGFR 74.2 Sodium 139, potassium 5, chloride 104, bicarb 24. AST 23, ALT 20, alkaline phosphatase 62. Hemoglobin 13.9, hematocrit 37.5 Total cholesterol 240, triglycerides 96, HDL 49, LDL 172, non-HDL 191.  (While she was on prior dose of Actemra) TSH 1.76  Collected: 01/29/2023 provided by patient. Total cholesterol 169, triglycerides 70, HDL 44, calculated LDL 112 (while she was off of Actemra)  IMPRESSION:    ICD-10-CM   1. Coronary artery calcification seen on computed tomography  I25.10     2. Agatston coronary artery calcium score less than 100  R93.1     3. Pure hypercholesterolemia  E78.00     4. Premature atrial contraction  I49.1     5. Atrial tachycardia  I47.19       RECOMMENDATIONS: Risa Auman is a 57 y.o. Caucasian female whose past medical history and cardiac risk factors include: Mild CAC (99.2 AU, 94th percentile), Seronegative rheumatoid arthritis (diagnosed in 1993), depression/anxiety, hyperlipidemia (primary versus medication side effect from biological agents used for rheumatoid arthritis).   Coronary artery calcification seen on computed tomography Agatston coronary artery calcium score less than 100 Total CAC 99.2 AU, 94th percentile.  To minimize risk of bleeding currently holding off aspirin 81 mg p.o. daily as she is already taking Mobic. Lipids from October 2023 noted LDL of 172 mg/dL this was while she was on  After discontinuation of Actemra for 3 months repeat lipids in February 2024 noted LDL 212 mg/dL. She restarted Actemra 6 weeks ago at half  the dose and is starting to vegan diet.  She will have repeat lipids with rheumatology or PCP for lipid management.  If needed patient is willing to be on statin therapy as other Biologics have not managed her rheumatoid arthritis well. Reemphasized importance of improving her modifiable cardiovascular risk factors.  Pure hypercholesterolemia LDL levels improved after stopping Actemra However, was not able to tolerate any other Biologics for her rheumatoid arthritis She is now back on  Actemra for the last 6 weeks and will have repeat lipids with PCP or rheumatology.  She is on a lower dose of Actemra when compared to the past If lipids are still not well-controlled would recommend statin therapy, also recommended by her rheumatologist.  Patient is agreeable Would recommend a goal LDL of less than at 99 mg/dL and if possible around 70 mg/dL.  Premature atrial contraction Atrial tachycardia Currently asymptomatic. Monitor for now.  FINAL MEDICATION LIST END OF ENCOUNTER: No orders of the defined types were placed in this encounter.   Medications Discontinued During This Encounter  Medication Reason   golimumab (SIMPONI ARIA) 50 MG/4ML SOLN injection    metoprolol tartrate (LOPRESSOR) 25 MG tablet    solifenacin (VESICARE) 10 MG tablet       Current Outpatient Medications:    Calcium-Vitamin D-Vitamin K (VIACTIV PO), Take 1 tablet by mouth daily., Disp: , Rfl:    ferrous sulfate 325 (65 FE) MG tablet, Take 325 mg by mouth daily with breakfast., Disp: , Rfl:    HYDROcodone-acetaminophen (NORCO) 10-325 MG tablet, Take 1 tablet by mouth every 6 (six) hours as needed for pain., Disp: , Rfl:    Meloxicam (MOBIC PO), Take by mouth., Disp: , Rfl:    Multiple Vitamin (MULTIVITAMIN WITH MINERALS) TABS tablet, Take 1 tablet by mouth daily., Disp: , Rfl:    Tocilizumab (ACTEMRA [place]), Inject into the skin., Disp: , Rfl:   No orders of the defined types were placed in this encounter.   There are  no Patient Instructions on file for this visit.   --Continue cardiac medications as reconciled in final medication list. --Return in about 1 year (around 03/24/2024) for Annual follow up visit, Coronary artery calcification. or sooner if needed. --Continue follow-up with your primary care physician regarding the management of your other chronic comorbid conditions.  Patient's questions and concerns were addressed to her satisfaction. She voices understanding of the instructions provided during this encounter.   This note was created using a voice recognition software as a result there may be grammatical errors inadvertently enclosed that do not reflect the nature of this encounter. Every attempt is made to correct such errors.  Tessa Lerner, [place], [facility]  Pager:  [phone] Office: [phone]

## 2023-05-29 ENCOUNTER — Other Ambulatory Visit: Payer: Self-pay | Admitting: Surgery

## 2023-05-29 DIAGNOSIS — M542 Cervicalgia: Secondary | ICD-10-CM

## 2023-06-24 ENCOUNTER — Encounter: Admitting: Physical Medicine & Rehabilitation

## 2023-07-01 ENCOUNTER — Other Ambulatory Visit

## 2023-09-13 ENCOUNTER — Encounter: Attending: Physical Medicine & Rehabilitation | Admitting: Physical Medicine & Rehabilitation

## 2023-09-13 ENCOUNTER — Encounter: Payer: Self-pay | Admitting: Physical Medicine & Rehabilitation

## 2023-09-13 VITALS — BP 104/73 | HR 91 | Ht 59.0 in | Wt 104.0 lb

## 2023-09-13 DIAGNOSIS — Z5181 Encounter for therapeutic drug level monitoring: Secondary | ICD-10-CM | POA: Diagnosis present

## 2023-09-13 DIAGNOSIS — Z79891 Long term (current) use of opiate analgesic: Secondary | ICD-10-CM

## 2023-09-13 DIAGNOSIS — M0520 Rheumatoid vasculitis with rheumatoid arthritis of unspecified site: Secondary | ICD-10-CM

## 2023-09-13 DIAGNOSIS — G894 Chronic pain syndrome: Secondary | ICD-10-CM | POA: Diagnosis present

## 2023-09-13 DIAGNOSIS — M2550 Pain in unspecified joint: Secondary | ICD-10-CM | POA: Diagnosis present

## 2023-09-13 MED ORDER — HYDROCODONE-ACETAMINOPHEN 5-325 MG PO TABS: 1.0000 | ORAL_TABLET | Freq: Three times a day (TID) | ORAL | 0 refills | Status: DC | PRN

## 2023-09-13 NOTE — Progress Notes (Addendum)
Subjective:    Patient ID: Diana Peters, female    DOB: 09/08/1966, 57 y.o.   MRN: [id number]  HPI  HPI   Diana Peters is a 57 y.o. year old female  who  has a past medical history of Arthritis, Heart murmur, Rheumatoid arthritis (HCC), and Rheumatoid arthritis (HCC).   They are presenting to PM&R clinic as a new patient for pain management evaluation. They were referred for treatment of chronic pain.  [name] has had pain due to RA which she has had for about 30 years, around 24.  She reports a lot of inflammation and severe disease due to her RA.  She has had multiple surgeries throughout her body due to this condition.  She reports her pain is everywhere.  She also has numbness and tingling in her fingertips that has a pins-and-needles type sensation.  She has recently been having issues with dizziness and vertigo.  She follows with Dr. Dierdre Forth Rheumatology- on actemra currently but she says she will be switched to a different medication soon.  Her PCP is Dr. Dorinda Hill.  She has difficulty swallowing big pills.  She is currently using hydrocodone 10 mg however she is breaking this pill in half and using this once or twice a day.  She reports she would like to wean off her opioid medications however pain becomes too severe when she tries to stop.   Prior procedures C3-C7 ACDF about 2 years ago Jaw reconstrtion- 1998 Shoulder replaced both- 2000 Both elbows -2005 and 2006 Both hips- 1997, 2012 Both knees- 1999 Both ankles- 2009 Left hand surgery- 2019 R thumb and wrist fused 2016 R great toe fused 2007     Medications tried: Tramadol Hydocodone Oxycodone Morphine Fentynyl patch Rheumatologist ordered- Tylenol and ibuprofen dont help Mobic helps with inflammation     Other treatments: Hips, knee-injection helped a little PT - after surgery helped to some degree  Interval history 03/25/2023 Diana Peters is here for follow-up regarding her body wide  pain related to rheumatoid arthritis. Her neck, back, feet continue to be some of the greater sources of her pain.  She feels like her pain is worse recently because rheumatology try her on a new medication.  She did not feel that the new medication was working.  She is now back on her prior medication but at a lower dose.  She continues to use hydrocodone.  She breaks her 10 mg tabs in half and takes half a tablet in the morning and half a tablet at night most days.  She will occasionally take an additional half tablet if the pain is very severe.  She has not yet needed a refill of this medication because she tries to use it so sparingly.  Interval history 09/13/2023 Body wide pain has been worse past several weeks.  She continues to use Norco 10 but breaks this in half and usually will just take half a tablet.  This helps keep her pain more tolerable.  She tries to use this medication sparingly and has not had a refill since November 2023.  She does not have any side effects with the Norco.  Rheumatoid arthritis medication was recently changed from Actemra to Kineret due to laboratory abnormalities with resultant worsening of her pain.   .Pain Inventory Average Pain 5 Pain Right Now 5 My pain is sharp, dull, stabbing, and aching  In the last 24 hours, has pain interfered with the following? General activity 7 Relation with  others 5 Enjoyment of life 7 What TIME of day is your pain at its worst? morning , daytime, evening, and night Sleep (in general) Poor  Pain is worse with: walking, bending, inactivity, and some activites Pain improves with: rest, heat/ice, and medication Relief from Meds: 6  Family History  Problem Relation Age of Onset   Healthy Mother    Healthy Father    Healthy Sister    Healthy Brother    Healthy Brother    Healthy Brother    Breast cancer Maternal Aunt    Breast cancer Maternal Aunt    Breast cancer Maternal Grandmother    Social History   Socioeconomic  History   Marital status: Divorced    Spouse name: Not on file   Number of children: 2   Years of education: Not on file   Highest education level: Not on file  Occupational History   Not on file  Tobacco Use   Smoking status: Never   Smokeless tobacco: Never  Vaping Use   Vaping status: Never Used  Substance and Sexual Activity   Alcohol use: Not Currently    Comment: very rarely   Drug use: Never   Sexual activity: Not Currently  Other Topics Concern   Not on file  Social History Narrative   ** Merged History Encounter **       Social Determinants of Health   Financial Resource Strain: Not on file  Food Insecurity: Not on file  Transportation Needs: Not on file  Physical Activity: Not on file  Stress: Not on file  Social Connections: Not on file   Past Surgical History:  Procedure Laterality Date   ANTERIOR CERVICAL DECOMPRESSION/DISCECTOMY FUSION 4 LEVELS N/A 10/26/2020   Procedure: ANTERIOR CERVICAL DECOMPRESSION/DISCECTOMY FUSION CERVICAL THREE-FOUR, CERVICAL FOUR-FIVE, CERVICAL FIVE-SIX, CERVICAL SIX-SEVEN;  Surgeon: Bedelia Person, MD;  Location: [facility] OR;  Service: Neurosurgery;  Laterality: N/A;   BREAST SURGERY Bilateral    augmentation   CESAREAN SECTION     x2   COLONOSCOPY     ESOPHAGOGASTRODUODENOSCOPY     finger joint replacementt Left    joint replacements of pointer and second finger   great toe fusion     great toe surgery Right    fusion right great toe   JOINT REPLACEMENT     MANDIBLE RECONSTRUCTION     REPLACEMENT TOTAL KNEE Bilateral    REVISION TOTAL HIP ARTHROPLASTY     bilat   REVISION TOTAL HIP ARTHROPLASTY Bilateral    TOTAL ANKLE ARTHROPLASTY     bilat   TOTAL ANKLE REPLACEMENT Bilateral    TOTAL ELBOW ARTHROPLASTY     bilat   TOTAL ELBOW REPLACEMENT Bilateral    TOTAL HIP ARTHROPLASTY     bilat   TOTAL HIP ARTHROPLASTY Bilateral    TOTAL KNEE ARTHROPLASTY     bilat   TOTAL SHOULDER ARTHROPLASTY     bilateral   TOTAL  SHOULDER REPLACEMENT Bilateral    TOTAL WRIST ARTHROPLASTY Right    UPPER GASTROINTESTINAL ENDOSCOPY     WISDOM TOOTH EXTRACTION     WRIST FUSION     Past Surgical History:  Procedure Laterality Date   ANTERIOR CERVICAL DECOMPRESSION/DISCECTOMY FUSION 4 LEVELS N/A 10/26/2020   Procedure: ANTERIOR CERVICAL DECOMPRESSION/DISCECTOMY FUSION CERVICAL THREE-FOUR, CERVICAL FOUR-FIVE, CERVICAL FIVE-SIX, CERVICAL SIX-SEVEN;  Surgeon: Bedelia Person, MD;  Location: [facility] OR;  Service: Neurosurgery;  Laterality: N/A;   BREAST SURGERY Bilateral    augmentation   CESAREAN SECTION  x2   COLONOSCOPY     ESOPHAGOGASTRODUODENOSCOPY     finger joint replacementt Left    joint replacements of pointer and second finger   great toe fusion     great toe surgery Right    fusion right great toe   JOINT REPLACEMENT     MANDIBLE RECONSTRUCTION     REPLACEMENT TOTAL KNEE Bilateral    REVISION TOTAL HIP ARTHROPLASTY     bilat   REVISION TOTAL HIP ARTHROPLASTY Bilateral    TOTAL ANKLE ARTHROPLASTY     bilat   TOTAL ANKLE REPLACEMENT Bilateral    TOTAL ELBOW ARTHROPLASTY     bilat   TOTAL ELBOW REPLACEMENT Bilateral    TOTAL HIP ARTHROPLASTY     bilat   TOTAL HIP ARTHROPLASTY Bilateral    TOTAL KNEE ARTHROPLASTY     bilat   TOTAL SHOULDER ARTHROPLASTY     bilateral   TOTAL SHOULDER REPLACEMENT Bilateral    TOTAL WRIST ARTHROPLASTY Right    UPPER GASTROINTESTINAL ENDOSCOPY     WISDOM TOOTH EXTRACTION     WRIST FUSION     Past Medical History:  Diagnosis Date   Arthritis    RA   Heart murmur    Echo completed 5 years ago  - [facility] - report stated everything was normal   Hyperlipidemia    Rheumatoid arthritis (HCC)    Rheumatoid arthritis (HCC)    BP 104/73   Pulse 91   Ht 4\' 11"  (1.499 m)   Wt 104 lb (47.2 kg)   SpO2 97%   BMI 21.01 kg/m   Opioid Risk Score:   Fall Risk Score:  `1  Depression screen [facility] 2/9     03/25/2023    3:31 PM 11/19/2022    2:04  PM  Depression screen PHQ 2/9  Decreased Interest 2 1  Down, Depressed, Hopeless 2 1  PHQ - 2 Score 4 2  Altered sleeping  1  Tired, decreased energy  3  Change in appetite  0  Feeling bad or failure about yourself   1  Trouble concentrating  1  Moving slowly or fidgety/restless  1  Suicidal thoughts  0  PHQ-9 Score  9  Difficult doing work/chores  Somewhat difficult     Review of Systems  Constitutional: Negative.   HENT: Negative.    Eyes: Negative.   Respiratory: Negative.    Cardiovascular: Negative.   Gastrointestinal: Negative.   Endocrine: Negative.   Genitourinary: Negative.   Musculoskeletal: Negative.   Skin: Negative.   Allergic/Immunologic: Negative.   Neurological: Negative.   Hematological: Negative.   Psychiatric/Behavioral: Negative.    All other systems reviewed and are negative.      Objective:   Physical Exam    Gen: no distress, normal appearing HEENT: oral mucosa pink and moist, NCAT Cardio: Reg rate Chest: normal effort, normal rate of breathing Abd: soft, non-distended Ext: no edema Psych: pleasant, normal affect Skin: intact Neuro: alert and awake, follows commands, CN 2-12 grossly intact Moving all 4 extremities Musculoskeletal: No active joint inflammation/swelling noted Surgical healed incision R wrist, L digits 1-2 Ulnar deviation fingers b/l  Decreased Rom b/l elbows Tenderness of the cervical, thoracic, and lumbar paraspinal muscles. 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   Multilevel cervical spondylosis as described above is better demonstrated on  the prior MRI.   02/27/22 MRI C spine  IMPRESSION: 1. 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 Associated mild basilar impression with the tip of the  dens partially impinging upon the medulla. No associated severe stenosis or syrinx at this level. 2. Advanced multilevel cervical spondylosis with resultant moderate to severe spinal stenosis at C3-4 through C6-7. Subtle patchy cord signal changes at the levels of C3-4 and C4-5 suspicious for myelopathy. 3. Multifactorial degenerative changes with resultant moderate to severe bilateral C3 through C7 foraminal stenosis as above.       Assessment & Plan:   Chronic back and neck pain status post C3-C7 ACDF and polyarthralgia due to RA -UDS and Pain agreement prior visit -Repeat UDS today -Norco 5 1-2 tabs Q8h PRN ordered -Continue PDMP monitoring, pill counts -Consider Cymbalta- she would like to avoid antidepressant class mediations -Continue f/u with rheumatology for RA -She continues to have pins-and-needles pain in her hands.  Discussed trying gabapentin or Lyrica which she declines.  -Continue Moloxicam -She has a TENS unit at home, continue use as needed

## 2023-09-19 LAB — TOXASSURE SELECT,+ANTIDEPR,UR

## 2023-10-25 ENCOUNTER — Encounter: Attending: Physical Medicine & Rehabilitation | Admitting: Physical Medicine & Rehabilitation

## 2023-10-25 ENCOUNTER — Encounter: Payer: Self-pay | Admitting: Physical Medicine & Rehabilitation

## 2023-10-25 VITALS — BP 125/82 | HR 102 | Wt 104.0 lb

## 2023-10-25 DIAGNOSIS — M2550 Pain in unspecified joint: Secondary | ICD-10-CM | POA: Diagnosis present

## 2023-10-25 DIAGNOSIS — G8929 Other chronic pain: Secondary | ICD-10-CM | POA: Diagnosis present

## 2023-10-25 DIAGNOSIS — M0520 Rheumatoid vasculitis with rheumatoid arthritis of unspecified site: Secondary | ICD-10-CM | POA: Diagnosis present

## 2023-10-25 DIAGNOSIS — Z79891 Long term (current) use of opiate analgesic: Secondary | ICD-10-CM

## 2023-10-25 DIAGNOSIS — M545 Low back pain, unspecified: Secondary | ICD-10-CM

## 2023-10-25 MED ORDER — HYDROCODONE-ACETAMINOPHEN 5-325 MG PO TABS: 1.0000 | ORAL_TABLET | Freq: Three times a day (TID) | ORAL | 0 refills | Status: DC | PRN

## 2023-10-25 NOTE — Progress Notes (Addendum)
 Subjective:    Patient ID: Diana Peters, female    DOB: Mar 13, 1966, 57 y.o.   MRN: [id number]  HPI  HPI   Diana Peters is a 57 y.o. year old female  who  has a past medical history of Arthritis, Heart murmur, Rheumatoid arthritis (HCC), and Rheumatoid arthritis (HCC).   They are presenting to PM&R clinic as a new patient for pain management evaluation. They were referred for treatment of chronic pain.  Diana Peters has had pain due to RA which she has had for about 30 years, around 57.  She reports a lot of inflammation and severe disease due to her RA.  She has had multiple surgeries throughout her body due to this condition.  She reports her pain is everywhere.  She also has numbness and tingling in her fingertips that has a pins-and-needles type sensation.  She has recently been having issues with dizziness and vertigo.  She follows with Dr. Mai Rheumatology- on actemra currently but she says she will be switched to a different medication soon.  Her PCP is Dr. Leita Blind.  She has difficulty swallowing big pills.  She is currently using hydrocodone  10 mg however she is breaking this pill in half and using this once or twice a day.  She reports she would like to wean off her opioid medications however pain becomes too severe when she tries to stop.   Prior procedures C3-C7 ACDF about 2 years ago Jaw reconstrtion- 1998 Shoulder replaced both- 2000 Both elbows -2005 and 2006 Both hips- 1997, 2012 Both knees- 1999 Both ankles- 2009 Left hand surgery- 2019 R thumb and wrist fused 2016 R great toe fused 2007     Medications tried: Tramadol Hydocodone Oxycodone Morphine  Fentynyl patch Rheumatologist ordered- Tylenol  and ibuprofen dont help Mobic helps with inflammation     Other treatments: Hips, knee-injection helped a little PT - after surgery helped to some degree  Interval history 03/25/2023 Diana Peters is here for follow-up regarding her body wide  pain related to rheumatoid arthritis. Her neck, back, feet continue to be some of the greater sources of her pain.  She feels like her pain is worse recently because rheumatology try her on a new medication.  She did not feel that the new medication was working.  She is now back on her prior medication but at a lower dose.  She continues to use hydrocodone .  She breaks her 10 mg tabs in half and takes half a tablet in the morning and half a tablet at night most days.  She will occasionally take an additional half tablet if the pain is very severe.  She has not yet needed a refill of this medication because she tries to use it so sparingly.  Interval history 09/13/2023 Body wide pain has been worse past several weeks.  She continues to use Norco 10 but breaks this in half and usually will just take half a tablet.  This helps keep her pain more tolerable.  She tries to use this medication sparingly and has not had a refill since November 2023.  She does not have any side effects with the Norco.  Rheumatoid arthritis medication was recently changed from Actemra to Kineret due to laboratory abnormalities with resultant worsening of her pain.  Interval history 10/21/23 Patient continues to have pain throughout her body.  Worst location will vary based on the day.  She frequently has pain in her neck and lower back and will often have  pain in the points of her upper and lower extremities.  She has been using Norco 5 mg 1-2 tabs.  This medication helps keep her pain more tolerable throughout the day.  She is sleepy used higher doses pain medication and this was helping her pain better, however she is trying to limit her use of this medication because she does not want to be on high doses.  She reports follow-up with rheumatology scheduled next month.   .Pain Inventory Average Pain 6 Pain Right Now 7 My pain is sharp, dull, stabbing, tingling, and aching  In the last 24 hours, has pain interfered with the  following? General activity 7 Relation with others 5 Enjoyment of life 7 What TIME of day is your pain at its worst? morning , daytime, evening, and night Sleep (in general) Fair  Pain is worse with: walking, bending, inactivity, and some activites Pain improves with: rest, heat/ice, and medication Relief from Meds: 5  Family History  Problem Relation Age of Onset   Healthy Mother    Healthy Father    Healthy Sister    Healthy Brother    Healthy Brother    Healthy Brother    Breast cancer Maternal Aunt    Breast cancer Maternal Aunt    Breast cancer Maternal Grandmother    Social History   Socioeconomic History   Marital status: Divorced    Spouse name: Not on file   Number of children: 2   Years of education: Not on file   Highest education level: Not on file  Occupational History   Not on file  Tobacco Use   Smoking status: Never   Smokeless tobacco: Never  Vaping Use   Vaping status: Never Used  Substance and Sexual Activity   Alcohol use: Not Currently    Comment: very rarely   Drug use: Never   Sexual activity: Not Currently  Other Topics Concern   Not on file  Social History Narrative   ** Merged History Encounter **       Social Determinants of Health   Financial Resource Strain: Not on file  Food Insecurity: Not on file  Transportation Needs: Not on file  Physical Activity: Not on file  Stress: Not on file  Social Connections: Not on file   Past Surgical History:  Procedure Laterality Date   ANTERIOR CERVICAL DECOMPRESSION/DISCECTOMY FUSION 4 LEVELS N/A 10/26/2020   Procedure: ANTERIOR CERVICAL DECOMPRESSION/DISCECTOMY FUSION CERVICAL THREE-FOUR, CERVICAL FOUR-FIVE, CERVICAL FIVE-SIX, CERVICAL SIX-SEVEN;  Surgeon: Debby Dorn MATSU, MD;  Location: [facility] OR;  Service: Neurosurgery;  Laterality: N/A;   BREAST SURGERY Bilateral    augmentation   CESAREAN SECTION     x2   COLONOSCOPY     ESOPHAGOGASTRODUODENOSCOPY     finger joint replacementt  Left    joint replacements of pointer and second finger   great toe fusion     great toe surgery Right    fusion right great toe   JOINT REPLACEMENT     MANDIBLE RECONSTRUCTION     REPLACEMENT TOTAL KNEE Bilateral    REVISION TOTAL HIP ARTHROPLASTY     bilat   REVISION TOTAL HIP ARTHROPLASTY Bilateral    TOTAL ANKLE ARTHROPLASTY     bilat   TOTAL ANKLE REPLACEMENT Bilateral    TOTAL ELBOW ARTHROPLASTY     bilat   TOTAL ELBOW REPLACEMENT Bilateral    TOTAL HIP ARTHROPLASTY     bilat   TOTAL HIP ARTHROPLASTY Bilateral    TOTAL KNEE  ARTHROPLASTY     bilat   TOTAL SHOULDER ARTHROPLASTY     bilateral   TOTAL SHOULDER REPLACEMENT Bilateral    TOTAL WRIST ARTHROPLASTY Right    UPPER GASTROINTESTINAL ENDOSCOPY     WISDOM TOOTH EXTRACTION     WRIST FUSION     Past Surgical History:  Procedure Laterality Date   ANTERIOR CERVICAL DECOMPRESSION/DISCECTOMY FUSION 4 LEVELS N/A 10/26/2020   Procedure: ANTERIOR CERVICAL DECOMPRESSION/DISCECTOMY FUSION CERVICAL THREE-FOUR, CERVICAL FOUR-FIVE, CERVICAL FIVE-SIX, CERVICAL SIX-SEVEN;  Surgeon: Debby Dorn MATSU, MD;  Location: [facility] OR;  Service: Neurosurgery;  Laterality: N/A;   BREAST SURGERY Bilateral    augmentation   CESAREAN SECTION     x2   COLONOSCOPY     ESOPHAGOGASTRODUODENOSCOPY     finger joint replacementt Left    joint replacements of pointer and second finger   great toe fusion     great toe surgery Right    fusion right great toe   JOINT REPLACEMENT     MANDIBLE RECONSTRUCTION     REPLACEMENT TOTAL KNEE Bilateral    REVISION TOTAL HIP ARTHROPLASTY     bilat   REVISION TOTAL HIP ARTHROPLASTY Bilateral    TOTAL ANKLE ARTHROPLASTY     bilat   TOTAL ANKLE REPLACEMENT Bilateral    TOTAL ELBOW ARTHROPLASTY     bilat   TOTAL ELBOW REPLACEMENT Bilateral    TOTAL HIP ARTHROPLASTY     bilat   TOTAL HIP ARTHROPLASTY Bilateral    TOTAL KNEE ARTHROPLASTY     bilat   TOTAL SHOULDER ARTHROPLASTY     bilateral   TOTAL  SHOULDER REPLACEMENT Bilateral    TOTAL WRIST ARTHROPLASTY Right    UPPER GASTROINTESTINAL ENDOSCOPY     WISDOM TOOTH EXTRACTION     WRIST FUSION     Past Medical History:  Diagnosis Date   Arthritis    RA   Heart murmur    Echo completed 5 years ago  - [facility] - report stated everything was normal   Hyperlipidemia    Rheumatoid arthritis (HCC)    Rheumatoid arthritis (HCC)    BP 125/82   Pulse (!) 102   Wt 104 lb (47.2 kg)   SpO2 95%   BMI 21.01 kg/m   Opioid Risk Score:   Fall Risk Score:  `1  Depression screen PHQ 2/9     10/25/2023    2:00 PM 03/25/2023    3:31 PM 11/19/2022    2:04 PM  Depression screen PHQ 2/9  Decreased Interest 0 2 1  Down, Depressed, Hopeless 0 2 1  PHQ - 2 Score 0 4 2  Altered sleeping   1  Tired, decreased energy   3  Change in appetite   0  Feeling bad or failure about yourself    1  Trouble concentrating   1  Moving slowly or fidgety/restless   1  Suicidal thoughts   0  PHQ-9 Score   9  Difficult doing work/chores   Somewhat difficult     Review of Systems  Constitutional: Negative.   HENT: Negative.    Eyes: Negative.   Respiratory: Negative.    Cardiovascular: Negative.   Gastrointestinal: Negative.   Endocrine: Negative.   Genitourinary: Negative.   Musculoskeletal: Negative.   Skin: Negative.   Allergic/Immunologic: Negative.   Neurological: Negative.   Hematological: Negative.   Psychiatric/Behavioral: Negative.    All other systems reviewed and are negative.      Objective:  Physical Exam    Gen: no distress, normal appearing HEENT: oral mucosa pink and moist, NCAT Chest: normal effort, normal rate of breathing Abd: soft, non-distended Ext: no edema Psych: pleasant, normal affect Skin: intact Neuro: alert and awake, follows commands, CN 2-12 grossly intact Moving all 4 extremities Musculoskeletal: Mild erythema in first and second MTP joints of right hand, no significant  warmth. Surgical healed incision R wrist, L digits 1-2 Ulnar deviation fingers b/l  Decreased Rom b/l elbows Tenderness of the cervical, thoracic, and lumbar paraspinal muscles. 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   Multilevel cervical spondylosis as described above is better demonstrated on the prior MRI.   02/27/22 MRI C spine  IMPRESSION: 1. 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 Associated mild basilar impression with the tip of the dens partially impinging upon the medulla. No associated severe stenosis or syrinx at this level. 2. Advanced multilevel cervical spondylosis with resultant moderate to severe spinal stenosis at C3-4 through C6-7. Subtle patchy cord signal changes at the levels of C3-4 and C4-5 suspicious for myelopathy. 3. Multifactorial degenerative changes with resultant moderate to severe bilateral C3 through C7 foraminal stenosis as above.       Assessment & Plan:   Chronic back and neck pain status post C3-C7 ACDF and polyarthralgia due to RA -Pain agreement prior visit -Continue monitor UDS, pill counts, PDMP -Norco 5 1-2 tabs Q8h PRN ordered -Pill counts consistent -Consider Cymbalta- she would like to avoid antidepressant class mediations -Continue f/u with rheumatology for RA -She continues to have pins-and-needles pain in her hands.  Discussed trying gabapentin or Lyrica which she declines. -Continue Moloxicam -She has a TENS unit at home, continue use as needed   07/02/24 pt visit was rescheduled today by office, will refill her medication x1, she has been rescheduled and would I like visit before further refills. Pt has visit in September but over a month. She would like to keep current visit for  now.  Reports using more norco due to adjustment to rheum medication.

## 2023-12-25 ENCOUNTER — Encounter: Payer: Self-pay | Admitting: Registered Nurse

## 2023-12-25 ENCOUNTER — Encounter: Attending: Physical Medicine & Rehabilitation | Admitting: Registered Nurse

## 2023-12-25 VITALS — BP 109/75 | HR 110 | Ht 59.0 in | Wt 106.0 lb

## 2023-12-25 DIAGNOSIS — M2550 Pain in unspecified joint: Secondary | ICD-10-CM | POA: Insufficient documentation

## 2023-12-25 DIAGNOSIS — R000 Tachycardia, unspecified: Secondary | ICD-10-CM | POA: Diagnosis present

## 2023-12-25 DIAGNOSIS — Z79891 Long term (current) use of opiate analgesic: Secondary | ICD-10-CM | POA: Insufficient documentation

## 2023-12-25 DIAGNOSIS — M0520 Rheumatoid vasculitis with rheumatoid arthritis of unspecified site: Secondary | ICD-10-CM | POA: Insufficient documentation

## 2023-12-25 DIAGNOSIS — Z5181 Encounter for therapeutic drug level monitoring: Secondary | ICD-10-CM | POA: Insufficient documentation

## 2023-12-25 DIAGNOSIS — G894 Chronic pain syndrome: Secondary | ICD-10-CM | POA: Diagnosis not present

## 2023-12-25 MED ORDER — HYDROCODONE-ACETAMINOPHEN 5-325 MG PO TABS: 1.0000 | ORAL_TABLET | Freq: Three times a day (TID) | ORAL | 0 refills | Status: DC | PRN
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
## 2024-02-17 ENCOUNTER — Encounter: Payer: Self-pay | Admitting: Registered Nurse

## 2024-02-17 ENCOUNTER — Encounter: Attending: Physical Medicine & Rehabilitation | Admitting: Registered Nurse

## 2024-02-17 VITALS — BP 111/76 | HR 94 | Ht 59.0 in | Wt 106.8 lb

## 2024-02-17 DIAGNOSIS — Z5181 Encounter for therapeutic drug level monitoring: Secondary | ICD-10-CM | POA: Diagnosis not present

## 2024-02-17 DIAGNOSIS — M0520 Rheumatoid vasculitis with rheumatoid arthritis of unspecified site: Secondary | ICD-10-CM | POA: Diagnosis not present

## 2024-02-17 DIAGNOSIS — M2550 Pain in unspecified joint: Secondary | ICD-10-CM | POA: Insufficient documentation

## 2024-02-17 DIAGNOSIS — Z79891 Long term (current) use of opiate analgesic: Secondary | ICD-10-CM | POA: Insufficient documentation

## 2024-02-17 DIAGNOSIS — G894 Chronic pain syndrome: Secondary | ICD-10-CM | POA: Insufficient documentation

## 2024-02-17 MED ORDER — HYDROCODONE-ACETAMINOPHEN 5-325 MG PO TABS
1.0000 | ORAL_TABLET | Freq: Three times a day (TID) | ORAL | 0 refills | Status: DC | PRN
Start: 2024-02-17 — End: 2024-04-21

## 2024-02-17 NOTE — Progress Notes (Signed)
 Subjective:    Patient ID: Diana Peters, female    DOB: 1966-06-15, 58 y.o.   MRN: [id number]  HPI: Diana Peters is a 58 y.o. female who returns for follow up appointment for chronic pain and medication refill. She reports generalized joint pain. She  rates her pain 5. Her current exercise regime is walking and performing stretching exercises.  Mr. Minogue Morphine equivalent is 15.00 MME.   Oral Swab was Performed today.     Pain Inventory Average Pain 5 Pain Right Now 5 My pain is constant, burning, and tingling  In the last 24 hours, has pain interfered with the following? General activity 5 Relation with others 5 Enjoyment of life 7 What TIME of day is your pain at its worst? morning , daytime, evening, and night Sleep (in general) Fair  Pain is worse with: some activites Pain improves with: rest and medication Relief from Meds: 7  Family History  Problem Relation Age of Onset   Healthy Mother    Healthy Father    Healthy Sister    Healthy Brother    Healthy Brother    Healthy Brother    Breast cancer Maternal Aunt    Breast cancer Maternal Aunt    Breast cancer Maternal Grandmother    Social History   Socioeconomic History   Marital status: Divorced    Spouse name: Not on file   Number of children: 2   Years of education: Not on file   Highest education level: Not on file  Occupational History   Not on file  Tobacco Use   Smoking status: Never   Smokeless tobacco: Never  Vaping Use   Vaping status: Never Used  Substance and Sexual Activity   Alcohol use: Not Currently    Comment: very rarely   Drug use: Never   Sexual activity: Not Currently  Other Topics Concern   Not on file  Social History Narrative   ** Merged History Encounter **       Social Drivers of Corporate investment banker Strain: Not on file  Food Insecurity: Not on file  Transportation Needs: Not on file  Physical Activity: Not on file  Stress: Not on  file  Social Connections: Not on file   Past Surgical History:  Procedure Laterality Date   ANTERIOR CERVICAL DECOMPRESSION/DISCECTOMY FUSION 4 LEVELS N/A 10/26/2020   Procedure: ANTERIOR CERVICAL DECOMPRESSION/DISCECTOMY FUSION CERVICAL THREE-FOUR, CERVICAL FOUR-FIVE, CERVICAL FIVE-SIX, CERVICAL SIX-SEVEN;  Surgeon: Bedelia Person, MD;  Location: [facility];  Service: Neurosurgery;  Laterality: N/A;   BREAST SURGERY Bilateral    augmentation   CESAREAN SECTION     x2   COLONOSCOPY     ESOPHAGOGASTRODUODENOSCOPY     finger joint replacementt Left    joint replacements of pointer and second finger   great toe fusion     great toe surgery Right    fusion right great toe   JOINT REPLACEMENT     MANDIBLE RECONSTRUCTION     REPLACEMENT TOTAL KNEE Bilateral    REVISION TOTAL HIP ARTHROPLASTY     bilat   REVISION TOTAL HIP ARTHROPLASTY Bilateral    TOTAL ANKLE ARTHROPLASTY     bilat   TOTAL ANKLE REPLACEMENT Bilateral    TOTAL ELBOW ARTHROPLASTY     bilat   TOTAL ELBOW REPLACEMENT Bilateral    TOTAL HIP ARTHROPLASTY     bilat   TOTAL HIP ARTHROPLASTY Bilateral    TOTAL KNEE ARTHROPLASTY  bilat   TOTAL SHOULDER ARTHROPLASTY     bilateral   TOTAL SHOULDER REPLACEMENT Bilateral    TOTAL WRIST ARTHROPLASTY Right    UPPER GASTROINTESTINAL ENDOSCOPY     WISDOM TOOTH EXTRACTION     WRIST FUSION     Past Surgical History:  Procedure Laterality Date   ANTERIOR CERVICAL DECOMPRESSION/DISCECTOMY FUSION 4 LEVELS N/A 10/26/2020   Procedure: ANTERIOR CERVICAL DECOMPRESSION/DISCECTOMY FUSION CERVICAL THREE-FOUR, CERVICAL FOUR-FIVE, CERVICAL FIVE-SIX, CERVICAL SIX-SEVEN;  Surgeon: Bedelia Person, MD;  Location: [facility];  Service: Neurosurgery;  Laterality: N/A;   BREAST SURGERY Bilateral    augmentation   CESAREAN SECTION     x2   COLONOSCOPY     ESOPHAGOGASTRODUODENOSCOPY     finger joint replacementt Left    joint replacements of pointer and second finger   great toe fusion      great toe surgery Right    fusion right great toe   JOINT REPLACEMENT     MANDIBLE RECONSTRUCTION     REPLACEMENT TOTAL KNEE Bilateral    REVISION TOTAL HIP ARTHROPLASTY     bilat   REVISION TOTAL HIP ARTHROPLASTY Bilateral    TOTAL ANKLE ARTHROPLASTY     bilat   TOTAL ANKLE REPLACEMENT Bilateral    TOTAL ELBOW ARTHROPLASTY     bilat   TOTAL ELBOW REPLACEMENT Bilateral    TOTAL HIP ARTHROPLASTY     bilat   TOTAL HIP ARTHROPLASTY Bilateral    TOTAL KNEE ARTHROPLASTY     bilat   TOTAL SHOULDER ARTHROPLASTY     bilateral   TOTAL SHOULDER REPLACEMENT Bilateral    TOTAL WRIST ARTHROPLASTY Right    UPPER GASTROINTESTINAL ENDOSCOPY     WISDOM TOOTH EXTRACTION     WRIST FUSION     Past Medical History:  Diagnosis Date   Arthritis    RA   Heart murmur    Echo completed 5 years ago  - [facility] - report stated everything was normal   Hyperlipidemia    Rheumatoid arthritis (HCC)    Rheumatoid arthritis (HCC)    BP 111/76   Pulse 94   Ht 4\' 11"  (1.499 m)   Wt 106 lb 12.8 oz (48.4 kg)   SpO2 99%   BMI 21.57 kg/m   Opioid Risk Score:   Fall Risk Score:  `1  Depression screen PHQ 2/9     02/17/2024    2:01 PM 12/25/2023    1:55 PM 10/25/2023    2:00 PM 03/25/2023    3:31 PM 11/19/2022    2:04 PM  Depression screen PHQ 2/9  Decreased Interest 0 0 0 2 1  Down, Depressed, Hopeless 0 0 0 2 1  PHQ - 2 Score 0 0 0 4 2  Altered sleeping     1  Tired, decreased energy     3  Change in appetite     0  Feeling bad or failure about yourself      1  Trouble concentrating     1  Moving slowly or fidgety/restless     1  Suicidal thoughts     0  PHQ-9 Score     9  Difficult doing work/chores     Somewhat difficult     Review of Systems  All other systems reviewed and are negative.      Objective:   Physical Exam Vitals and nursing note reviewed.  Constitutional:      Appearance: Normal appearance.  Cardiovascular:  Rate and Rhythm: Normal rate  and regular rhythm.     Pulses: Normal pulses.     Heart sounds: Normal heart sounds.  Pulmonary:     Effort: Pulmonary effort is normal.     Breath sounds: Normal breath sounds.  Musculoskeletal:     Comments: Normal Muscle Bulk and Muscle Testing Reveals:  Upper Extremities: Full ROM and Muscle Strength 5/5 Bilateral AC Joint Tenderness Bilateral Greater Trochanter Tenderness  Lower Extremities: Full ROM and Muscle Strength 5/5 Arises from Table slowly Narrow Based  Gait     Skin:    General: Skin is warm and dry.  Neurological:     Mental Status: She is alert and oriented to person, place, and time.  Psychiatric:        Mood and Affect: Mood normal.        Behavior: Behavior normal.         Assessment & Plan:  Rheumatoid Arteritis: Rheumatologist Following. Continue current medication regimen. 02/17/2024 Polyarthralgia: Continue HEP as Tolerated. Continue to Monitor. 02/17/2024 3.  Chronic Pain Syndrome: Refilled: Hydrocodone 5-325 mg 1- 2 tablets every 8 hours as needed for pain #90. We will continue the opioid monitoring program, this consists of regular clinic visits, examinations, urine drug screen, pill counts as well as use of [place] Controlled Substance Reporting system. A 12 month History has been reviewed on the [place] Controlled Substance Reporting System on 02/17/2024.    F/U in 3 months

## 2024-02-21 LAB — DRUG TOX MONITOR 1 W/CONF, ORAL FLD

## 2024-02-21 LAB — DRUG TOX ALC METAB W/CON, ORAL FLD: Alcohol Metabolite: NEGATIVE ng/mL (ref <25)

## 2024-03-24 ENCOUNTER — Ambulatory Visit: Payer: Self-pay | Admitting: Cardiology

## 2024-04-13 ENCOUNTER — Other Ambulatory Visit: Payer: Self-pay | Admitting: Obstetrics and Gynecology

## 2024-04-13 DIAGNOSIS — R928 Other abnormal and inconclusive findings on diagnostic imaging of breast: Secondary | ICD-10-CM

## 2024-04-20 ENCOUNTER — Ambulatory Visit

## 2024-04-20 ENCOUNTER — Ambulatory Visit
Admission: RE | Admit: 2024-04-20 | Discharge: 2024-04-20 | Disposition: A | Discharge status: Home / Self Care | Source: Ambulatory Visit | Attending: Obstetrics and Gynecology | Admitting: Obstetrics and Gynecology

## 2024-04-20 ENCOUNTER — Ambulatory Visit: Admitting: Registered Nurse

## 2024-04-20 DIAGNOSIS — R928 Other abnormal and inconclusive findings on diagnostic imaging of breast: Secondary | ICD-10-CM

## 2024-04-21 ENCOUNTER — Telehealth: Payer: Self-pay | Admitting: Registered Nurse

## 2024-04-21 MED ORDER — HYDROCODONE-ACETAMINOPHEN 5-325 MG PO TABS: 1.0000 | ORAL_TABLET | Freq: Three times a day (TID) | ORAL | 0 refills | Status: DC | PRN

## 2024-04-21 NOTE — Telephone Encounter (Signed)
 PMP was Reviewed.  Hydrocodone  e-scribed to pharmacy.  Ms. Diana Peters is aware via My-Chart message.

## 2024-05-18 ENCOUNTER — Ambulatory Visit: Admitting: Physical Medicine & Rehabilitation

## 2024-06-16 ENCOUNTER — Ambulatory Visit: Admitting: Registered Nurse

## 2024-07-02 ENCOUNTER — Encounter: Admitting: Physical Medicine & Rehabilitation

## 2024-07-02 ENCOUNTER — Telehealth: Payer: Self-pay | Admitting: Registered Nurse

## 2024-07-02 MED ORDER — HYDROCODONE-ACETAMINOPHEN 5-325 MG PO TABS: 1.0000 | ORAL_TABLET | Freq: Three times a day (TID) | ORAL | 0 refills | Status: DC | PRN

## 2024-07-02 NOTE — Telephone Encounter (Signed)
 P left vm on nurseline for refill of hydrocoodone  [facility] [address]

## 2024-08-20 ENCOUNTER — Encounter: Attending: Physical Medicine & Rehabilitation | Admitting: Physical Medicine & Rehabilitation

## 2024-08-20 ENCOUNTER — Encounter: Payer: Self-pay | Admitting: Physical Medicine & Rehabilitation

## 2024-08-20 VITALS — BP 106/73 | HR 111 | Ht 59.0 in | Wt 102.0 lb

## 2024-08-20 DIAGNOSIS — M545 Low back pain, unspecified: Secondary | ICD-10-CM | POA: Diagnosis present

## 2024-08-20 DIAGNOSIS — Z5181 Encounter for therapeutic drug level monitoring: Secondary | ICD-10-CM | POA: Diagnosis present

## 2024-08-20 DIAGNOSIS — Z79891 Long term (current) use of opiate analgesic: Secondary | ICD-10-CM | POA: Diagnosis not present

## 2024-08-20 DIAGNOSIS — G894 Chronic pain syndrome: Secondary | ICD-10-CM | POA: Diagnosis present

## 2024-08-20 DIAGNOSIS — G8929 Other chronic pain: Secondary | ICD-10-CM | POA: Insufficient documentation

## 2024-08-20 DIAGNOSIS — M0520 Rheumatoid vasculitis with rheumatoid arthritis of unspecified site: Secondary | ICD-10-CM | POA: Diagnosis present

## 2024-08-20 MED ORDER — HYDROCODONE-ACETAMINOPHEN 5-325 MG PO TABS: 1.0000 | ORAL_TABLET | Freq: Three times a day (TID) | ORAL | 0 refills | Status: AC | PRN

## 2024-08-20 NOTE — Progress Notes (Signed)
 Subjective:    Patient ID: Diana Peters, female    DOB: Jan 19, 1966, 58 y.o.   MRN: [id number]   HPI   Diana Peters is a 59 y.o. year old female  who  has a past medical history of Arthritis, Heart murmur, Rheumatoid arthritis (HCC), and Rheumatoid arthritis (HCC).   They are presenting to PM&R clinic as a new patient for pain management evaluation. They were referred for treatment of chronic pain.  Diana Peters has had pain due to RA which she has had for about 30 years, around 61.  She reports a lot of inflammation and severe disease due to her RA.  She has had multiple surgeries throughout her body due to this condition.  She reports her pain is everywhere.  She also has numbness and tingling in her fingertips that has a pins-and-needles type sensation.  She has recently been having issues with dizziness and vertigo.  She follows with Dr. Mai Rheumatology- on actemra currently but she says she will be switched to a different medication soon.  Her PCP is Dr. Leita Blind.  She has difficulty swallowing big pills.  She is currently using hydrocodone  10 mg however she is breaking this pill in half and using this once or twice a day.  She reports she would like to wean off her opioid medications however pain becomes too severe when she tries to stop.   Prior procedures C3-C7 ACDF about 2 years ago Jaw reconstrtion- 1998 Shoulder replaced both- 2000 Both elbows -2005 and 2006 Both hips- 1997, 2012 Both knees- 1999 Both ankles- 2009 Left hand surgery- 2019 R thumb and wrist fused 2016 R great toe fused 2007     Medications tried: Tramadol Hydocodone Oxycodone Morphine  Fentynyl patch Rheumatologist ordered- Tylenol  and ibuprofen dont help Mobic helps with inflammation     Other treatments: Hips, knee-injection helped a little PT - after surgery helped to some degree   Interval history 03/25/2023 Diana Peters is here for follow-up regarding her body wide pain  related to rheumatoid arthritis. Her neck, back, feet continue to be some of the greater sources of her pain.  She feels like her pain is worse recently because rheumatology try her on a new medication.  She did not feel that the new medication was working.  She is now back on her prior medication but at a lower dose.  She continues to use hydrocodone .  She breaks her 10 mg tabs in half and takes half a tablet in the morning and half a tablet at night most days.  She will occasionally take an additional half tablet if the pain is very severe.  She has not yet needed a refill of this medication because she tries to use it so sparingly.   Interval history 09/13/2023 Body wide pain has been worse past several weeks.  She continues to use Norco 10 but breaks this in half and usually will just take half a tablet.  This helps keep her pain more tolerable.  She tries to use this medication sparingly and has not had a refill since November 2023.  She does not have any side effects with the Norco.  Rheumatoid arthritis medication was recently changed from Actemra to Kineret due to laboratory abnormalities with resultant worsening of her pain.   Interval history 10/21/23 Patient continues to have pain throughout her body.  Worst location will vary based on the day.  She frequently has pain in her neck and lower back and will  often have pain in the points of her upper and lower extremities.  She has been using Norco 5 mg 1-2 tabs.  This medication helps keep her pain more tolerable throughout the day.  She is sleepy used higher doses pain medication and this was helping her pain better, however she is trying to limit her use of this medication because she does not want to be on high doses.  She reports follow-up with rheumatology scheduled next month.     Interval History 08/20/24 Reports a rough four months with poor control of rheumatoid arthritis symptoms. The current rheumatoid medication is not working well. A  rheumatologist plans to switch to a medication that was effective in the past, which is known to increase cholesterol.   Pain medication (hydrocodone ) is reported as okay. On some days, has been taking higher dose (two tablets, twice daily). Other days, usage is lower, sometimes one tablet per day or none, depending on activity level. Denies using additional Tylenol . Reports the current pain medication regimen is acceptable while waiting for the new rheumatoid medication to become effective.  Taking two hydrocodone  tablets at once provides better pain relief than one, but is not done often. It mildly exacerbates pre-existing dizziness, which has been an issue since 2018. Due to the dizziness, avoids taking pain medication before driving. Has a history of trying other opioids including morphine , Vicodin, Norco, and Percocet, all of which worsen the underlying dizziness.  Reports neuropathic pain in the hands and feet, described as burning and tingling. The neuropathy is worse in the hands. Numbness in the fingertips began approximately seven years ago and has since progressed down the hands. Is aware of nerve pain medications like gabapentin, Lyrica, and Cymbalta but is hesitant to try them due to concerns about side effects.   Pain Inventory Average Pain 6 Pain Right Now 6 My pain is burning, dull, stabbing, tingling, and aching  In the last 24 hours, has pain interfered with the following? General activity 8 Relation with others 8 Enjoyment of life 8 What TIME of day is your pain at its worst? morning , daytime, evening, and night Sleep (in general) Fair  Pain is worse with: walking, bending, sitting, inactivity, standing, and some activites Pain improves with: rest, heat/ice, therapy/exercise, and medication Relief from Meds: 5  Family History  Problem Relation Age of Onset   Healthy Mother    Healthy Father    Healthy Sister    Healthy Brother    Healthy Brother    Healthy  Brother    Breast cancer Maternal Aunt    Breast cancer Maternal Aunt    Breast cancer Maternal Grandmother    Social History   Socioeconomic History   Marital status: Divorced    Spouse name: Not on file   Number of children: 2   Years of education: Not on file   Highest education level: Not on file  Occupational History   Not on file  Tobacco Use   Smoking status: Never   Smokeless tobacco: Never  Vaping Use   Vaping status: Never Used  Substance and Sexual Activity   Alcohol use: Not Currently    Comment: very rarely   Drug use: Never   Sexual activity: Not Currently  Other Topics Concern   Not on file  Social History Narrative   ** Merged History Encounter **       Social Drivers of Health   Financial Resource Strain: Not on file  Food Insecurity: Not on  file  Transportation Needs: Not on file  Physical Activity: Not on file  Stress: Not on file  Social Connections: Not on file   Past Surgical History:  Procedure Laterality Date   ANTERIOR CERVICAL DECOMPRESSION/DISCECTOMY FUSION 4 LEVELS N/A 10/26/2020   Procedure: ANTERIOR CERVICAL DECOMPRESSION/DISCECTOMY FUSION CERVICAL THREE-FOUR, CERVICAL FOUR-FIVE, CERVICAL FIVE-SIX, CERVICAL SIX-SEVEN;  Surgeon: Debby Dorn MATSU, MD;  Location: [facility] OR;  Service: Neurosurgery;  Laterality: N/A;   BREAST SURGERY Bilateral    augmentation   CESAREAN SECTION     x2   COLONOSCOPY     ESOPHAGOGASTRODUODENOSCOPY     finger joint replacementt Left    joint replacements of pointer and second finger   great toe fusion     great toe surgery Right    fusion right great toe   JOINT REPLACEMENT     MANDIBLE RECONSTRUCTION     REPLACEMENT TOTAL KNEE Bilateral    REVISION TOTAL HIP ARTHROPLASTY     bilat   REVISION TOTAL HIP ARTHROPLASTY Bilateral    TOTAL ANKLE ARTHROPLASTY     bilat   TOTAL ANKLE REPLACEMENT Bilateral    TOTAL ELBOW ARTHROPLASTY     bilat   TOTAL ELBOW REPLACEMENT Bilateral    TOTAL HIP ARTHROPLASTY      bilat   TOTAL HIP ARTHROPLASTY Bilateral    TOTAL KNEE ARTHROPLASTY     bilat   TOTAL SHOULDER ARTHROPLASTY     bilateral   TOTAL SHOULDER REPLACEMENT Bilateral    TOTAL WRIST ARTHROPLASTY Right    UPPER GASTROINTESTINAL ENDOSCOPY     WISDOM TOOTH EXTRACTION     WRIST FUSION     Past Surgical History:  Procedure Laterality Date   ANTERIOR CERVICAL DECOMPRESSION/DISCECTOMY FUSION 4 LEVELS N/A 10/26/2020   Procedure: ANTERIOR CERVICAL DECOMPRESSION/DISCECTOMY FUSION CERVICAL THREE-FOUR, CERVICAL FOUR-FIVE, CERVICAL FIVE-SIX, CERVICAL SIX-SEVEN;  Surgeon: Debby Dorn MATSU, MD;  Location: [facility] OR;  Service: Neurosurgery;  Laterality: N/A;   BREAST SURGERY Bilateral    augmentation   CESAREAN SECTION     x2   COLONOSCOPY     ESOPHAGOGASTRODUODENOSCOPY     finger joint replacementt Left    joint replacements of pointer and second finger   great toe fusion     great toe surgery Right    fusion right great toe   JOINT REPLACEMENT     MANDIBLE RECONSTRUCTION     REPLACEMENT TOTAL KNEE Bilateral    REVISION TOTAL HIP ARTHROPLASTY     bilat   REVISION TOTAL HIP ARTHROPLASTY Bilateral    TOTAL ANKLE ARTHROPLASTY     bilat   TOTAL ANKLE REPLACEMENT Bilateral    TOTAL ELBOW ARTHROPLASTY     bilat   TOTAL ELBOW REPLACEMENT Bilateral    TOTAL HIP ARTHROPLASTY     bilat   TOTAL HIP ARTHROPLASTY Bilateral    TOTAL KNEE ARTHROPLASTY     bilat   TOTAL SHOULDER ARTHROPLASTY     bilateral   TOTAL SHOULDER REPLACEMENT Bilateral    TOTAL WRIST ARTHROPLASTY Right    UPPER GASTROINTESTINAL ENDOSCOPY     WISDOM TOOTH EXTRACTION     WRIST FUSION     Past Medical History:  Diagnosis Date   Arthritis    RA   Heart murmur    Echo completed 5 years ago  - [facility] - report stated everything was normal   Hyperlipidemia    Rheumatoid arthritis (HCC)    Rheumatoid arthritis (HCC)    There were no  vitals taken for this visit.  Opioid Risk Score:   Fall Risk Score:   `1  Depression screen PHQ 2/9     02/17/2024    2:01 PM 12/25/2023    1:55 PM 10/25/2023    2:00 PM 03/25/2023    3:31 PM 11/19/2022    2:04 PM  Depression screen PHQ 2/9  Decreased Interest 0 0 0 2 1  Down, Depressed, Hopeless 0 0 0 2 1  PHQ - 2 Score 0 0 0 4 2  Altered sleeping     1  Tired, decreased energy     3  Change in appetite     0  Feeling bad or failure about yourself      1  Trouble concentrating     1  Moving slowly or fidgety/restless     1  Suicidal thoughts     0  PHQ-9 Score     9  Difficult doing work/chores     Somewhat difficult     Review of Systems  Musculoskeletal:  Positive for myalgias.  All other systems reviewed and are negative.      Objective:   Gen: no distress, normal appearing HEENT: oral mucosa pink and moist, NCAT Chest: normal effort, normal rate of breathing Abd: soft, non-distended Ext: no edema Psych: pleasant, normal affect Skin: intact Neuro: alert and awake, follows commands, CN 2-12 grossly intact Moving all 4 extremities Musculoskeletal:  Examination reveals diffuse tenderness to light palpation over the hands, shoulders, back, and knees. 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   Multilevel cervical spondylosis as described above is better demonstrated on the prior MRI.   02/27/22 MRI C spine  IMPRESSION: 1. 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 Associated mild basilar impression with the tip of the dens partially impinging upon the medulla. No associated severe stenosis or syrinx at this level. 2. Advanced multilevel cervical spondylosis with resultant moderate to severe spinal stenosis at C3-4 through C6-7. Subtle patchy cord signal changes at the levels of C3-4  and C4-5 suspicious for myelopathy. 3. Multifactorial degenerative changes with resultant moderate to severe bilateral C3 through C7 foraminal stenosis as above.            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  Continue PDMP monitoring.  Pain contract completed prior visit. -Discussed bringing pill bottle with any medications even if empty to all appointments -Norco 5mg  1-2 tabs Q8h PRN ordered, #120 -Pill counts consistent -Consider Cymbalta- she would like to avoid antidepressant class mediations -Continue f/u with rheumatology for RA - She reports chronic numbness/pins-and-needles pain in her hands and feet.  Discussed trying gabapentin or Lyrica which she declines.  Could consider Qutenza for her feet.  Could consider EMG nerve conduction study if this worsens. -Continue Moloxicam -She has a TENS unit at home, continue use as needed

## 2024-10-19 ENCOUNTER — Encounter: Admitting: Physical Medicine & Rehabilitation

## 2025-01-22 ENCOUNTER — Ambulatory Visit: Admitting: Physical Therapy
